# Patient Record
Sex: Female | Born: 1970 | Race: White | Hispanic: No | Smoking: Never smoker
Health system: Southern US, Community
[De-identification: ages and names within clinical notes are randomized; demographics above are authoritative.]

## PROBLEM LIST (undated history)

## (undated) DIAGNOSIS — R87619 Unspecified abnormal cytological findings in specimens from cervix uteri: Secondary | ICD-10-CM

## (undated) DIAGNOSIS — M069 Rheumatoid arthritis, unspecified: Secondary | ICD-10-CM

## (undated) DIAGNOSIS — R42 Dizziness and giddiness: Secondary | ICD-10-CM

## (undated) DIAGNOSIS — D496 Neoplasm of unspecified behavior of brain: Secondary | ICD-10-CM

## (undated) DIAGNOSIS — A64 Unspecified sexually transmitted disease: Secondary | ICD-10-CM

## (undated) DIAGNOSIS — F429 Obsessive-compulsive disorder, unspecified: Secondary | ICD-10-CM

## (undated) DIAGNOSIS — H9190 Unspecified hearing loss, unspecified ear: Secondary | ICD-10-CM

## (undated) DIAGNOSIS — N809 Endometriosis, unspecified: Secondary | ICD-10-CM

## (undated) HISTORY — PX: INTRAUTERINE DEVICE (IUD) INSERTION: SHX5877

## (undated) HISTORY — PX: CRYOTHERAPY: SHX1416

## (undated) HISTORY — PX: BRAIN TUMOR EXCISION: SHX577

## (undated) HISTORY — DX: Unspecified abnormal cytological findings in specimens from cervix uteri: R87.619

## (undated) HISTORY — DX: Unspecified sexually transmitted disease: A64

## (undated) HISTORY — DX: Unspecified hearing loss, unspecified ear: H91.90

## (undated) HISTORY — DX: Rheumatoid arthritis, unspecified: M06.9

## (undated) HISTORY — DX: Endometriosis, unspecified: N80.9

## (undated) HISTORY — DX: Obsessive-compulsive disorder, unspecified: F42.9

## (undated) HISTORY — DX: Dizziness and giddiness: R42

## (undated) HISTORY — DX: Neoplasm of unspecified behavior of brain: D49.6

---

## 2001-07-18 DIAGNOSIS — R87619 Unspecified abnormal cytological findings in specimens from cervix uteri: Secondary | ICD-10-CM

## 2001-07-18 HISTORY — DX: Unspecified abnormal cytological findings in specimens from cervix uteri: R87.619

## 2006-04-11 ENCOUNTER — Other Ambulatory Visit: Admission: RE | Admit: 2006-04-11 | Discharge: 2006-04-11 | Payer: Self-pay | Admitting: Obstetrics and Gynecology

## 2007-04-30 ENCOUNTER — Other Ambulatory Visit: Admission: RE | Admit: 2007-04-30 | Discharge: 2007-04-30 | Payer: Self-pay | Admitting: Obstetrics and Gynecology

## 2007-05-22 ENCOUNTER — Ambulatory Visit (HOSPITAL_COMMUNITY): Admission: RE | Admit: 2007-05-22 | Discharge: 2007-05-22 | Payer: Self-pay | Admitting: Obstetrics and Gynecology

## 2008-07-08 ENCOUNTER — Other Ambulatory Visit: Admission: RE | Admit: 2008-07-08 | Discharge: 2008-07-08 | Payer: Self-pay | Admitting: Obstetrics & Gynecology

## 2010-02-18 ENCOUNTER — Ambulatory Visit: Payer: Self-pay | Admitting: Emergency Medicine

## 2010-02-18 LAB — CONVERTED CEMR LAB
Bilirubin Urine: NEGATIVE
Glucose, Urine, Semiquant: NEGATIVE
Ketones, urine, test strip: NEGATIVE
Nitrite: NEGATIVE

## 2010-02-19 ENCOUNTER — Encounter: Payer: Self-pay | Admitting: Emergency Medicine

## 2010-07-18 DIAGNOSIS — N809 Endometriosis, unspecified: Secondary | ICD-10-CM

## 2010-07-18 HISTORY — DX: Endometriosis, unspecified: N80.9

## 2010-08-17 NOTE — Assessment & Plan Note (Signed)
Summary: FEVER,BODY ACHING/WB   Vital Signs:  Patient Profile:   40 Years Old Female CC:      fevert, body aches, lower right back pain x yesterday Height:     66 inches Weight:      172 pounds O2 Sat:      97 % O2 treatment:    Room Air Temp:     100.7 degrees F oral Pulse rate:   117 / minute Resp:     18 per minute BP sitting:   149 / 96  (right arm) Cuff size:   regular  Pt. in pain?   yes    Location:   lower right back    Intensity:   10    Type:       dull/ache  Vitals Entered By: Lajean Saver RN (February 18, 2010 2:36 PM)                   Updated Prior Medication List: XANAX 0.5 MG TABS (ALPRAZOLAM) prn GABAPENTIN 300 MG CAPS (GABAPENTIN) tid ZOFRAN 4 MG TABS (ONDANSETRON HCL) prn ACYCLOVIR 400 MG TABS (ACYCLOVIR) bid SEASONIQUE 0.15-0.03 &0.01 MG TABS (LEVONORGEST-ETH ESTRAD 91-DAY) once daily  Current Allergies: No known allergies History of Present Illness Chief Complaint: fevert, body aches, lower right back pain x yesterday History of Present Illness: Fever, body aches, R LBP for 1 day.  No urinary symptoms.  Ibuprofen helps.  No N/V/C/D.  She does have a history of pyelo in the past and wanted this to be treated quickly if it is a UTI  REVIEW OF SYSTEMS Constitutional Symptoms       Complains of fatigue.     Denies fever, chills, night sweats, weight loss, and weight gain.  Eyes       Denies change in vision, eye pain, eye discharge, glasses, contact lenses, and eye surgery. Ear/Nose/Throat/Mouth       Denies hearing loss/aids, change in hearing, ear pain, ear discharge, dizziness, frequent runny nose, frequent nose bleeds, sinus problems, sore throat, hoarseness, and tooth pain or bleeding.  Respiratory       Denies dry cough, productive cough, wheezing, shortness of breath, asthma, bronchitis, and emphysema/COPD.  Cardiovascular       Denies murmurs, chest pain, and tires easily with exhertion.    Gastrointestinal       Denies stomach pain,  nausea/vomiting, diarrhea, constipation, blood in bowel movements, and indigestion. Genitourniary       Denies painful urination, kidney stones, and loss of urinary control. Neurological       Denies paralysis, seizures, and fainting/blackouts. Musculoskeletal       Complains of muscle pain and joint stiffness.      Denies joint pain, decreased range of motion, redness, swelling, muscle weakness, and gout.      Comments: body aches, lower right backk pain Skin       Denies bruising, unusual mles/lumps or sores, and hair/skin or nail changes.  Psych       Denies mood changes, temper/anger issues, anxiety/stress, speech problems, depression, and sleep problems.  Past History:  Past Medical History: malignant brain tumor kidneyn infection  Past Surgical History: brain tumor removed x 2   Family History: none  Social History: Never Smoked Alcohol use-yes, 5/week Drug use-no Smoking Status:  never Drug Use:  no Physical Exam General appearance: well developed, well nourished, no acute distress Chest/Lungs: no rales, wheezes, or rhonchi bilateral, breath sounds equal without effort Heart: regular rate and  rhythm, no murmur Back: +CVAT on right Skin: no obvious rashes or lesions Assessment New Problems: UTI (ICD-599.0)   Patient Education: Patient and/or caregiver instructed in the following: Ibuprofen prn.  Plan New Medications/Changes: CIPRO 250 MG TABS (CIPROFLOXACIN HCL) 1 tab by mouth two times a day for 10 days  #20 x 0, 02/18/2010, Hoyt Koch MD  New Orders: New Patient Level III [phone] UA Dipstick w/o Micro (automated)  [81003] Planning Comments:   Hydration If symptoms get more severe, more pain, blood in urine, nausea, go to the ER   The patient and/or caregiver has been counseled thoroughly with regard to medications prescribed including dosage, schedule, interactions, rationale for use, and possible side effects and they verbalize understanding.   Diagnoses and expected course of recovery discussed and will return if not improved as expected or if the condition worsens. Patient and/or caregiver verbalized understanding.  Prescriptions: CIPRO 250 MG TABS (CIPROFLOXACIN HCL) 1 tab by mouth two times a day for 10 days  #20 x 0   Entered and Authorized by:   Hoyt Koch MD   Signed by:   Hoyt Koch MD on 02/18/2010   Method used:   Telephoned to ...         RxID:   [id number]   Orders Added: 1)  New Patient Level III [95284] 2)  UA Dipstick w/o Micro (automated)  [81003]  Laboratory Results   Urine Tests  Date/Time Received: February 18, 2010 3:33 PM  Date/Time Reported: February 18, 2010 3:33 PM   Routine Urinalysis   Color: orange Appearance: Hazy Glucose: negative   (Normal Range: Negative) Bilirubin: negative   (Normal Range: Negative) Ketone: negative   (Normal Range: Negative) Spec. Gravity: 1.025   (Normal Range: 1.003-1.035) Blood: large   (Normal Range: Negative) pH: 6.0   (Normal Range: 5.0-8.0) Protein: 100   (Normal Range: Negative) Urobilinogen: 0.2   (Normal Range: 0-1) Nitrite: negative   (Normal Range: Negative) Leukocyte Esterace: large   (Normal Range: Negative)

## 2011-01-20 ENCOUNTER — Ambulatory Visit (INDEPENDENT_AMBULATORY_CARE_PROVIDER_SITE_OTHER): Payer: PRIVATE HEALTH INSURANCE | Admitting: Cardiovascular Disease

## 2011-01-20 ENCOUNTER — Encounter: Payer: Self-pay | Admitting: *Deleted

## 2011-01-20 ENCOUNTER — Encounter: Payer: Self-pay | Admitting: Cardiovascular Disease

## 2011-01-20 VITALS — BP 124/83 | HR 98 | Resp 18 | Ht 66.0 in | Wt 163.0 lb

## 2011-01-20 DIAGNOSIS — R0600 Dyspnea, unspecified: Secondary | ICD-10-CM

## 2011-01-20 DIAGNOSIS — R079 Chest pain, unspecified: Secondary | ICD-10-CM

## 2011-01-20 DIAGNOSIS — R0602 Shortness of breath: Secondary | ICD-10-CM

## 2011-01-20 NOTE — Assessment & Plan Note (Signed)
Normal cardiopulmonary exam.  Likely related to stress  Stress echo

## 2011-01-20 NOTE — Progress Notes (Signed)
40 yo referred by [facility] for atypical SSCP dyspnea and dizzyness.  ER w.u was negative Revewed notes from 6/28 with normal ECG.  ? Vertigo.  She has a history of multiple brain tumors. S/P XRT at [facility], [place].  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  At [facility] she indicates there may be a heart issue but very vague.  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  Apparantly got disability approved last week and will be able to work part time which should help  SSCP in chest radiated to shoulders.  Not always exertional.  She does a lot of running, elliptical and yoga without a problem  Functional dyspnea seems related to stress  ROS: Denies fever, malais, weight loss, blurry vision, decreased visual acuity, cough, sputum, SOB, hemoptysis, pleuritic pain, palpitaitons, heartburn, abdominal pain, melena, lower extremity edema, claudication, or rash.  All other systems reviewed and negative   General: Affect appropriate Healthy:  appears stated age HEENT: normal Neck supple with no adenopathy JVP normal no bruits no thyromegaly Lungs clear with no wheezing and good diaphragmatic motion Heart:  S1/S2 no murmur,rub, gallop or click PMI normal Abdomen: benighn, BS positve, no tenderness, no AAA no bruit.  No HSM or HJR Distal pulses intact with no bruits No edema Neuro non-focal Skin warm and dry No muscular weakness  Medications Current Outpatient Prescriptions  Medication Sig Dispense Refill  . acetaminophen (TYLENOL) 500 MG tablet Take 500 mg by mouth every 6 (six) hours as needed.        . Acetaminophen-Caff-Pyrilamine (MIDOL MAX ST MENSTRUAL) 500-60-15 MG TABS Take by mouth.        . ACYCLOVIR EX Apply topically.        . ALPRAZolam (XANAX PO) Take by mouth.        . Gabapentin (NEURONTIN PO) Take by mouth.        . hydrochlorothiazide (,MICROZIDE/HYDRODIURIL,) 12.5 MG capsule Take 12.5 mg by mouth daily.        Marland Kitchen ibuprofen (ADVIL,MOTRIN) 200 MG tablet Take 200 mg by mouth  every 6 (six) hours as needed.        . Multiple Vitamins-Minerals (CENTRUM) tablet Take 1 tablet by mouth daily.        . ondansetron (ZOFRAN-ODT) 8 MG disintegrating tablet Take 8 mg by mouth every 8 (eight) hours as needed.        . Ondansetron HCl (ZOFRAN PO) Take by mouth.        . OxyCODONE HCl, Abuse Deter, 5 MG TABS Take by mouth.        . predniSONE (DELTASONE) 20 MG tablet Take 20 mg by mouth 3 (three) times daily.        . SERTRALINE HCL PO Take by mouth.          Allergies Oxycodone  Family History: No family history on file.  Social History: History   Social History  . Marital Status: Divorced    Spouse Name: N/A    Number of Children: N/A  . Years of Education: N/A   Occupational History  . Not on file.   Social History Main Topics  . Smoking status: Never Smoker   . Smokeless tobacco: Not on file  . Alcohol Use: Not on file  . Drug Use: Not on file  . Sexually Active: Not on file   Other Topics Concern  . Not on file   Social History Narrative  . No narrative on  file    Electrocardiogram:  NSR 01/12/11 [facility]  Normal ECG  Assessment and Plan

## 2011-01-20 NOTE — Assessment & Plan Note (Signed)
Atypical and normal ECG  F.U stress echo

## 2011-01-20 NOTE — Patient Instructions (Signed)
Your physician recommends that you schedule a follow-up appointment in:as needed Your physician has requested that you have a stress echocardiogram. For further information please visit [email or web address] Please follow instruction sheet as given.

## 2011-01-24 ENCOUNTER — Encounter: Payer: Self-pay | Admitting: *Deleted

## 2011-01-27 ENCOUNTER — Ambulatory Visit (HOSPITAL_COMMUNITY)
Admission: RE | Admit: 2011-01-27 | Discharge: 2011-01-27 | Disposition: A | Discharge status: Home / Self Care | Payer: Medicare Other | Source: Ambulatory Visit | Attending: Cardiovascular Disease | Admitting: Cardiovascular Disease

## 2011-01-27 ENCOUNTER — Ambulatory Visit (HOSPITAL_COMMUNITY): Admission: RE | Admit: 2011-01-27 | Payer: Medicare Other | Source: Ambulatory Visit

## 2011-01-27 DIAGNOSIS — R0609 Other forms of dyspnea: Secondary | ICD-10-CM | POA: Insufficient documentation

## 2011-01-27 DIAGNOSIS — R079 Chest pain, unspecified: Secondary | ICD-10-CM | POA: Insufficient documentation

## 2011-01-27 DIAGNOSIS — R0989 Other specified symptoms and signs involving the circulatory and respiratory systems: Secondary | ICD-10-CM

## 2011-01-27 NOTE — Progress Notes (Signed)
  Stress Lab Nurses Notes - Yesenia Mcclure  Yesenia Mcclure 01/27/2011  Reason for doing test: Dyspnea  Type of test: Stress Echo  Nurse performing test: Parke Poisson, RN  Nuclear Medicine Tech: Not Applicable  Echo Tech: Karrie Doffing  MD performing test: R. Dietrich Pates  Family MD: Mare Loan   Test explained and consent signed: yes  IV started: No IV started  Symptoms: mild SOB  Treatment/Intervention: None  Reason test stopped: fatigue  After recovery IV was: None  Patient to return to Nuc. Med at : NA   Patient discharged: Home  Patient's Condition upon discharge was: stable  Comments: symptoms resolved in recovery.  Erskine Speed T

## 2011-01-28 ENCOUNTER — Encounter: Payer: Self-pay | Admitting: Cardiovascular Disease

## 2011-01-28 DIAGNOSIS — B977 Papillomavirus as the cause of diseases classified elsewhere: Secondary | ICD-10-CM | POA: Insufficient documentation

## 2012-05-25 ENCOUNTER — Other Ambulatory Visit (HOSPITAL_COMMUNITY): Payer: Self-pay | Admitting: Family Medicine

## 2012-05-25 DIAGNOSIS — Z1231 Encounter for screening mammogram for malignant neoplasm of breast: Secondary | ICD-10-CM

## 2012-06-15 ENCOUNTER — Ambulatory Visit (HOSPITAL_COMMUNITY)
Admission: RE | Admit: 2012-06-15 | Discharge: 2012-06-15 | Disposition: A | Discharge status: Home / Self Care | Payer: Medicare Other | Source: Ambulatory Visit | Attending: Family Medicine | Admitting: Family Medicine

## 2012-06-15 DIAGNOSIS — Z1231 Encounter for screening mammogram for malignant neoplasm of breast: Secondary | ICD-10-CM

## 2013-05-22 ENCOUNTER — Other Ambulatory Visit: Payer: Self-pay

## 2013-05-22 ENCOUNTER — Telehealth: Payer: Self-pay | Admitting: Certified Nurse Midwife

## 2013-05-22 MED ORDER — ACYCLOVIR 400 MG PO TABS: 400.0000 mg | ORAL_TABLET | Freq: Two times a day (BID) | ORAL | Status: DC

## 2013-05-22 NOTE — Telephone Encounter (Signed)
rx sent to [facility] to approve. See approved rx

## 2013-05-22 NOTE — Telephone Encounter (Signed)
Pt states she needs refill of acyclovir sent to her pharmacy at [facility] [address] in [place]. Pt states she only uses this med when needed & needs a refill. Pt has appt for aex 1/15

## 2013-05-22 NOTE — Telephone Encounter (Signed)
Last aex was 08-06-12. At that time pt didn't need rx but told us she would call when she needs a refill of acyclovir. Pt requesting rx to be sent to [facility] [address] in [place]. Pt states she uses this med prn. aex is scheduled for 1/15. Med has been pended already for you to sign. Please approve rx

## 2013-05-22 NOTE — Telephone Encounter (Signed)
[facility] calling to fill Rx for acyclovir 400. They are a new pharmacy for patient.

## 2013-08-07 ENCOUNTER — Ambulatory Visit: Payer: Self-pay | Admitting: Certified Nurse Midwife

## 2013-08-13 ENCOUNTER — Encounter: Payer: Self-pay | Admitting: Certified Nurse Midwife

## 2013-08-14 ENCOUNTER — Ambulatory Visit: Payer: Self-pay | Admitting: Certified Nurse Midwife

## 2013-08-22 ENCOUNTER — Ambulatory Visit: Payer: Self-pay | Admitting: Certified Nurse Midwife

## 2013-08-27 ENCOUNTER — Encounter: Payer: Self-pay | Admitting: Certified Nurse Midwife

## 2013-08-27 ENCOUNTER — Ambulatory Visit (INDEPENDENT_AMBULATORY_CARE_PROVIDER_SITE_OTHER): Payer: MEDICARE | Admitting: Certified Nurse Midwife

## 2013-08-27 VITALS — BP 112/72 | HR 72 | Resp 16 | Ht 65.25 in | Wt 158.0 lb

## 2013-08-27 DIAGNOSIS — D496 Neoplasm of unspecified behavior of brain: Secondary | ICD-10-CM

## 2013-08-27 DIAGNOSIS — Z8742 Personal history of other diseases of the female genital tract: Secondary | ICD-10-CM

## 2013-08-27 DIAGNOSIS — B009 Herpesviral infection, unspecified: Secondary | ICD-10-CM

## 2013-08-27 DIAGNOSIS — Z01419 Encounter for gynecological examination (general) (routine) without abnormal findings: Secondary | ICD-10-CM

## 2013-08-27 MED ORDER — ACYCLOVIR 400 MG PO TABS: 400.0000 mg | ORAL_TABLET | Freq: Two times a day (BID) | ORAL | Status: DC

## 2013-08-27 NOTE — Progress Notes (Signed)
43 y.o. G0P0000 Divorced Caucasian Fe here for annual exam. Periods scant, no issues. Currently being treated for UTI from Urology with Bactrim DS. Patient feeling better, today. Brain tumor is stable with no changes!!! No partner change , no STD concerns or testing. Sees PCP for aex and oncology for follow up with brain tumor. All labs done with these practices. No other health issues today.  Patient's last menstrual period was 07/23/2012.          Sexually active: yes  The current method of family planning is IUD.    Exercising: yes  cardio & strength training Smoker:  no  Health Maintenance: Pap:  08-06-12 neg MMG:  2013 neg, patient plans to schedule. Colonoscopy:  none BMD:   none TDaP:  2014 Labs: none Self breast exam: done monthly   reports that she has never smoked. She does not have any smokeless tobacco history on file. She reports that she drinks about 2.0 ounces of alcohol per week. She reports that she does not use illicit drugs.  Past Medical History  Diagnosis Date  . Brain tumor     brain meningioma(3), 1 malignant, radiation  . Light headed   . Chest pain   . Abnormal Pap smear of cervix 2003    had cryo  . OCD (obsessive compulsive disorder)   . Hearing loss     left  . RA (rheumatoid arthritis)   . STD (sexually transmitted disease)     HSV2  . Endometriosis 1/12    per dr Ubaldo Glassing    Past Surgical History  Procedure Laterality Date  . Cryotherapy      for abnormal pap smear 2003  . Intrauterine device (iud) insertion      mirena inserted 2/13 due for removal 2/18    Current Outpatient Prescriptions  Medication Sig Dispense Refill  . acetaminophen (TYLENOL) 500 MG tablet Take 500 mg by mouth every 6 (six) hours as needed.        . Acetaminophen-Caff-Pyrilamine (MIDOL MAX ST MENSTRUAL) 500-60-15 MG TABS Take by mouth.        Marland Kitchen acyclovir (ZOVIRAX) 400 MG tablet Take 1 tablet (400 mg total) by mouth 2 (two) times daily.  60 tablet  2  . amitriptyline  (ELAVIL) 10 MG tablet daily.      Marland Kitchen gabapentin (NEURONTIN) 300 MG capsule Take 300 mg by mouth 3 (three) times daily.      . hydrochlorothiazide (,MICROZIDE/HYDRODIURIL,) 12.5 MG capsule Take 12.5 mg by mouth daily.        Marland Kitchen HYDROCODONE-ACETAMINOPHEN PO Take by mouth as needed.      . hydrOXYzine (ATARAX/VISTARIL) 10 MG tablet daily.      Marland Kitchen ibuprofen (ADVIL,MOTRIN) 200 MG tablet Take 200 mg by mouth every 6 (six) hours as needed.        Marland Kitchen levonorgestrel (MIRENA) 20 MCG/24HR IUD 1 each by Intrauterine route once.      . Multiple Vitamins-Minerals (CENTRUM) tablet Take 1 tablet by mouth daily.        . ondansetron (ZOFRAN-ODT) 8 MG disintegrating tablet Take 8 mg by mouth every 8 (eight) hours as needed.        . SERTRALINE HCL PO Take 100 mg by mouth daily.       Marland Kitchen sulfamethoxazole-trimethoprim (BACTRIM DS) 800-160 MG per tablet 2 (two) times daily.      Marland Kitchen UNKNOWN TO PATIENT Anxiety med take bid       No current facility-administered medications for this visit.  Family History  Problem Relation Age of Onset  . Migraines Father   . Hypertension Father   . Hypertension Mother   . Cancer Maternal Grandfather     ROS:  Pertinent items are noted in HPI.  Otherwise, a comprehensive ROS was negative.  Exam:   BP 112/72  Pulse 72  Resp 16  Ht 5' 5.25" (1.657 m)  Wt 158 lb (71.668 kg)  BMI 26.10 kg/m2  LMP 07/23/2012 Height: 5' 5.25" (165.7 cm)  Ht Readings from Last 3 Encounters:  08/27/13 5' 5.25" (1.657 m)  01/20/11 5\' 6"  (1.676 m)  02/18/10 5\' 6"  (1.676 m)    General appearance: alert, cooperative and appears stated age Head: Normocephalic, without obvious abnormality, atraumatic Neck: no adenopathy, supple, symmetrical, trachea midline and thyroid normal to inspection and palpation Lungs: clear to auscultation bilaterally Breasts: normal appearance, no masses or tenderness, No nipple retraction or dimpling, No nipple discharge or bleeding, No axillary or supraclavicular  adenopathy Heart: regular rate and rhythm Abdomen: soft, non-tender; no masses,  no organomegaly Extremities: extremities normal, atraumatic, no cyanosis or edema Skin: Skin color, texture, turgor normal. No rashes or lesions Lymph nodes: Cervical, supraclavicular, and axillary nodes normal. No abnormal inguinal nodes palpated Neurologic: Grossly normal   Pelvic: External genitalia:  no lesions              Urethra:  normal appearing urethra with no masses, tenderness or lesions              Bartholin's and Skene's: normal                 Vagina: normal appearing vagina with normal color and discharge, no lesions              Cervix: normal, non tender IUD string noted in cervix              Pap taken: yes Bimanual Exam:  Uterus:  normal size, contour, position, consistency, mobility, non-tender and anteverted              Adnexa: normal adnexa and no mass, fullness, tenderness               Rectovaginal: Confirms               Anus:  normal sphincter tone, no lesions  A:  Well Woman with normal exam  Contraception Mirena IUD  History of abnormal pap smear with Cryo  Brain Menigeomas under follow up  History of Obsessive/compulsive disorder on medication  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

## 2013-08-27 NOTE — Patient Instructions (Signed)

## 2013-08-30 LAB — IPS PAP SMEAR ONLY

## 2013-08-30 NOTE — Progress Notes (Signed)
Reviewed personally.  M. Suzanne Berneice Zettlemoyer, MD.  

## 2013-09-20 ENCOUNTER — Telehealth: Payer: Self-pay | Admitting: Certified Nurse Midwife

## 2013-09-20 NOTE — Telephone Encounter (Signed)
[employer] [facility] is calling wanting records for  08/27/13 Attn: Neoma Laming fax [phone]

## 2014-01-09 ENCOUNTER — Other Ambulatory Visit: Payer: Self-pay | Admitting: Certified Nurse Midwife

## 2014-01-09 NOTE — Telephone Encounter (Signed)
eScribe request from [facility] for refill on ACYCLOVIR Last filled - 08/27/13, #60 X 3 to be taken BID Last AEX - 08/27/13 Next AEX - 08/28/14  Please advise refills.

## 2014-04-01 ENCOUNTER — Other Ambulatory Visit: Payer: Self-pay | Admitting: Certified Nurse Midwife

## 2014-04-01 DIAGNOSIS — Z1231 Encounter for screening mammogram for malignant neoplasm of breast: Secondary | ICD-10-CM

## 2014-04-23 ENCOUNTER — Ambulatory Visit (HOSPITAL_COMMUNITY)
Admission: RE | Admit: 2014-04-23 | Discharge: 2014-04-23 | Disposition: A | Discharge status: Home / Self Care | Payer: Medicare Other | Source: Ambulatory Visit | Attending: Certified Nurse Midwife | Admitting: Certified Nurse Midwife

## 2014-04-23 DIAGNOSIS — Z1231 Encounter for screening mammogram for malignant neoplasm of breast: Secondary | ICD-10-CM | POA: Diagnosis present

## 2014-05-16 DIAGNOSIS — D420 Neoplasm of uncertain behavior of cerebral meninges: Secondary | ICD-10-CM | POA: Insufficient documentation

## 2014-05-19 ENCOUNTER — Other Ambulatory Visit: Payer: Self-pay | Admitting: *Deleted

## 2014-05-19 MED ORDER — ACYCLOVIR 400 MG PO TABS: ORAL_TABLET | ORAL | Status: DC

## 2014-05-19 NOTE — Telephone Encounter (Signed)
[facility] 400 mg tablet Last Refilled: 01/09/14 #60/3 rfs (patients take 2 daily) Last AEX: 08/27/13 with Ms. Debbie Aex Scheduled: 08/28/14 with Ms. Lance Muss to refill until AEX next year?  (Routed to Ms. Patty given Ms. Jackelyn Poling is out of office today)

## 2014-08-28 ENCOUNTER — Ambulatory Visit: Payer: MEDICARE | Admitting: Certified Nurse Midwife

## 2014-08-29 ENCOUNTER — Encounter: Payer: Self-pay | Admitting: Certified Nurse Midwife

## 2014-08-29 ENCOUNTER — Ambulatory Visit (INDEPENDENT_AMBULATORY_CARE_PROVIDER_SITE_OTHER): Payer: Medicare Other | Admitting: Certified Nurse Midwife

## 2014-08-29 VITALS — BP 120/82 | HR 70 | Resp 16 | Ht 65.5 in | Wt 154.0 lb

## 2014-08-29 DIAGNOSIS — B009 Herpesviral infection, unspecified: Secondary | ICD-10-CM

## 2014-08-29 DIAGNOSIS — Z124 Encounter for screening for malignant neoplasm of cervix: Secondary | ICD-10-CM

## 2014-08-29 DIAGNOSIS — Z01419 Encounter for gynecological examination (general) (routine) without abnormal findings: Secondary | ICD-10-CM

## 2014-08-29 MED ORDER — ACYCLOVIR 400 MG PO TABS: ORAL_TABLET | ORAL | Status: AC

## 2014-08-29 NOTE — Progress Notes (Signed)
44 y.o. G0P0000 Divorced  Caucasian Fe here for annual exam. Periods none with Mirena IUD. Brain tumor reocured and hadradiation in past year, again, repeat MRI soon for status. Sees Oncology for follow up. No STD screening needed. Sees PCP yearly for aex and labs. No health issues today.  No LMP recorded. Patient is not currently having periods (Reason: IUD).          Sexually active: Yes.    The current method of family planning is IUD.    Exercising: Yes.    interval training & strength Smoker:  no  Health Maintenance: Pap:  08-27-13 neg MMG: 04-23-14 category d, birads 1:neg  3D Colonoscopy:  none BMD:   none TDaP:  2014 Labs: none Self breast exam: not done   reports that she has never smoked. She does not have any smokeless tobacco history on file. She reports that she drinks about 2.0 - 3.0 oz of alcohol per week. She reports that she does not use illicit drugs.  Past Medical History  Diagnosis Date  . Brain tumor     brain meningioma(3), 1 malignant, radiation  . Light headed   . Chest pain   . Abnormal Pap smear of cervix 2003    had cryo  . OCD (obsessive compulsive disorder)   . Hearing loss     left  . RA (rheumatoid arthritis)   . STD (sexually transmitted disease)     HSV2  . Endometriosis 1/12    per dr Ubaldo Glassing    Past Surgical History  Procedure Laterality Date  . Cryotherapy      for abnormal pap smear 2003  . Intrauterine device (iud) insertion      mirena inserted 2/13 due for removal 2/18    Current Outpatient Prescriptions  Medication Sig Dispense Refill  . acetaminophen (TYLENOL) 500 MG tablet Take 500 mg by mouth every 6 (six) hours as needed.      Marland Kitchen acyclovir (ZOVIRAX) 400 MG tablet TAKE 1 TABLET BY MOUTH TWICE A DAY 60 tablet 12  . amitriptyline (ELAVIL) 10 MG tablet daily.    . diazepam (VALIUM) 2 MG tablet   2  . gabapentin (NEURONTIN) 300 MG capsule Take 300 mg by mouth 3 (three) times daily.    . hydrochlorothiazide (,MICROZIDE/HYDRODIURIL,)  12.5 MG capsule Take 12.5 mg by mouth daily.      Marland Kitchen HYDROCODONE-ACETAMINOPHEN PO Take by mouth as needed.    . hydrOXYzine (ATARAX/VISTARIL) 10 MG tablet daily.    Marland Kitchen ibuprofen (ADVIL,MOTRIN) 200 MG tablet Take 200 mg by mouth every 6 (six) hours as needed.      Marland Kitchen levonorgestrel (MIRENA) 20 MCG/24HR IUD 1 each by Intrauterine route once.    . Multiple Vitamins-Minerals (CENTRUM) tablet Take 1 tablet by mouth daily.      . ondansetron (ZOFRAN-ODT) 8 MG disintegrating tablet Take 8 mg by mouth every 8 (eight) hours as needed.      . SERTRALINE HCL PO Take 100 mg by mouth daily.      No current facility-administered medications for this visit.    Family History  Problem Relation Age of Onset  . Migraines Father   . Hypertension Father   . Hypertension Mother   . Cancer Maternal Grandfather     ROS:  Pertinent items are noted in HPI.  Otherwise, a comprehensive ROS was negative.  Exam:   BP 120/82 mmHg  Pulse 70  Resp 16  Ht 5' 5.5" (1.664 m)  Wt 154 lb (  69.854 kg)  BMI 25.23 kg/m2 Height: 5' 5.5" (166.4 cm) Ht Readings from Last 3 Encounters:  08/29/14 5' 5.5" (1.664 m)  08/27/13 5' 5.25" (1.657 m)  01/20/11 5\' 6"  (1.676 m)    General appearance: alert, cooperative and appears stated age Head: Normocephalic, without obvious abnormality, atraumatic Neck: no adenopathy, supple, symmetrical, trachea midline and thyroid normal to inspection and palpation Lungs: clear to auscultation bilaterally Breasts: normal appearance, no masses or tenderness, No nipple retraction or dimpling, No nipple discharge or bleeding, No axillary or supraclavicular adenopathy Heart: regular rate and rhythm Abdomen: soft, non-tender; no masses,  no organomegaly Extremities: extremities normal, atraumatic, no cyanosis or edema Skin: Skin color, texture, turgor normal. No rashes or lesions Lymph nodes: Cervical, supraclavicular, and axillary nodes normal. No abnormal inguinal nodes palpated Neurologic:  Grossly normal   Pelvic: External genitalia:  no lesions              Urethra:  normal appearing urethra with no masses, tenderness or lesions              Bartholin's and Skene's: normal                 Vagina: normal appearing vagina with normal color and discharge, no lesions              Cervix: normal, non tender, no lesions IUD string noted              Pap taken: Yes.   Bimanual Exam:  Uterus:  normal size, contour, position, consistency, mobility, non-tender              Adnexa: normal adnexa and no mass, fullness, tenderness               Rectovaginal: Confirms               Anus:  normal sphincter tone, no lesions  Chaperone present: Yes  A:  Well Woman with normal exam  Contraception Mirena IUD  Brain tumor again with radiation under follow up  History of abnormal pap with cryo  History of HSV uses acyclovir needs rx  P:   Reviewed health and wellness pertinent to exam  Aware of warning signs with IUD, removal 2018  Rx  Acyclovir see order  Pap smear taken today with HPV reflex  Continue MD follow up as indicated   counseled on breast self exam, mammography screening, adequate intake of calcium and vitamin D, diet and exercise  return annually or prn  An After Visit Summary was printed and given to the patient.

## 2014-08-29 NOTE — Progress Notes (Signed)
Reviewed personally.  M. Suzanne Jeniel Slauson, MD.  

## 2014-08-29 NOTE — Patient Instructions (Signed)

## 2014-09-03 LAB — IPS PAP TEST WITH HPV

## 2014-10-29 ENCOUNTER — Ambulatory Visit: Payer: Self-pay | Admitting: Certified Nurse Midwife

## 2015-09-18 ENCOUNTER — Ambulatory Visit: Payer: Medicare Other | Admitting: Certified Nurse Midwife

## 2015-09-18 ENCOUNTER — Telehealth: Payer: Self-pay | Admitting: Certified Nurse Midwife

## 2015-09-18 NOTE — Telephone Encounter (Signed)
Patient cancelled appt today for AEX due to her being sick. Rescheduled patient for 09/22/2015 at 10:30.

## 2015-09-22 ENCOUNTER — Ambulatory Visit: Payer: Medicare Other | Admitting: Certified Nurse Midwife

## 2015-09-22 ENCOUNTER — Telehealth: Payer: Self-pay | Admitting: Certified Nurse Midwife

## 2015-09-22 NOTE — Telephone Encounter (Signed)
Patient canceled her 10:30am appointment today due to illness. Patient rescheduled to 09/29/15.

## 2015-09-29 ENCOUNTER — Ambulatory Visit: Payer: Medicare Other | Admitting: Certified Nurse Midwife

## 2015-10-05 IMAGING — MG MM DIGITAL SCREENING BILAT
4 series · 4 of 4 positions shown · non-contrast
Comparison: Previous exam(s).

CLINICAL DATA: Screening.

EXAM:
DIGITAL SCREENING BILATERAL MAMMOGRAM WITH CAD

[R CC]
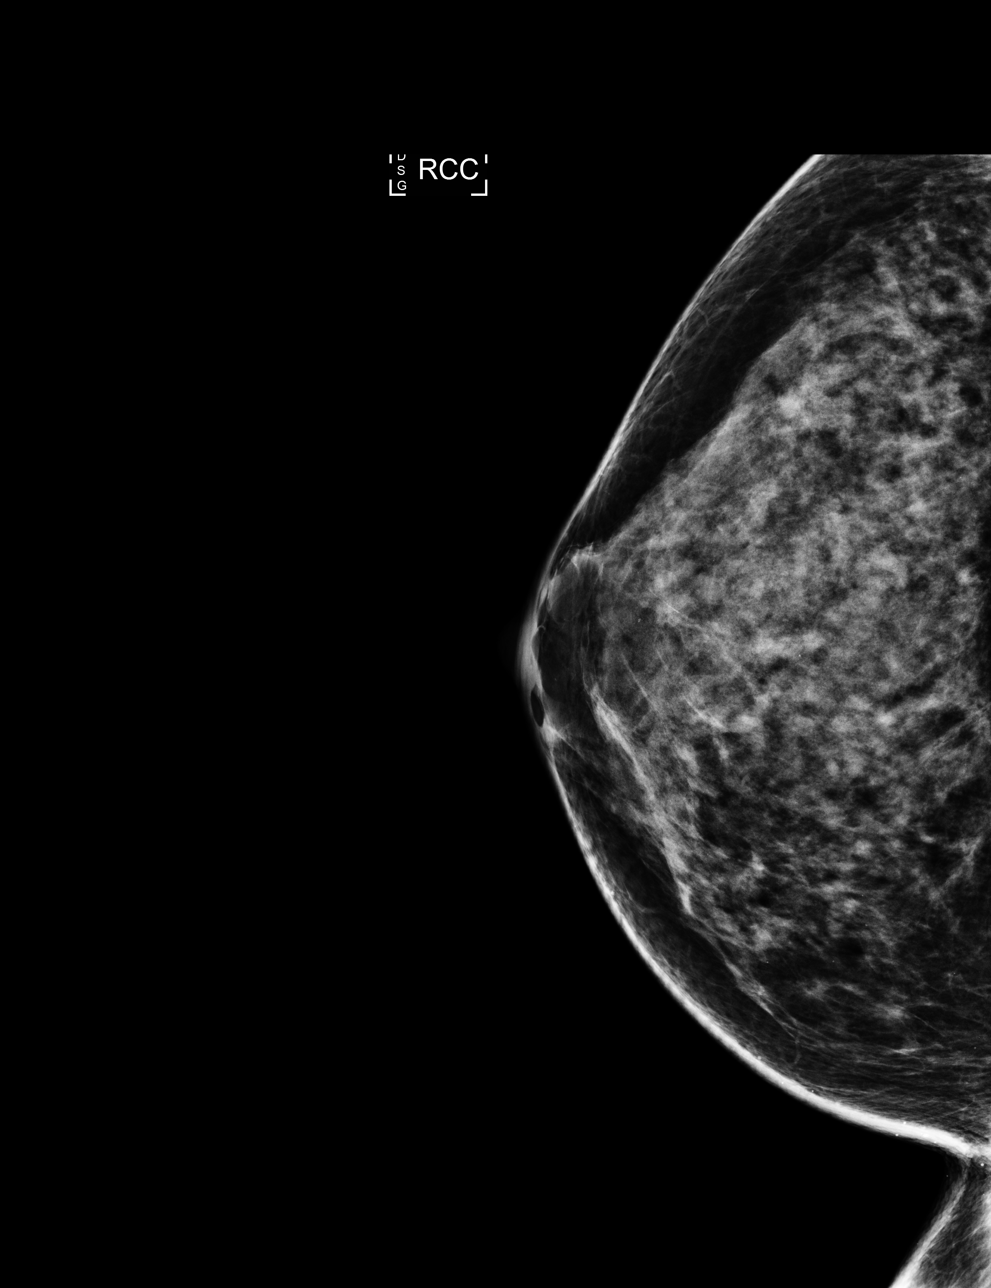

[L CC]
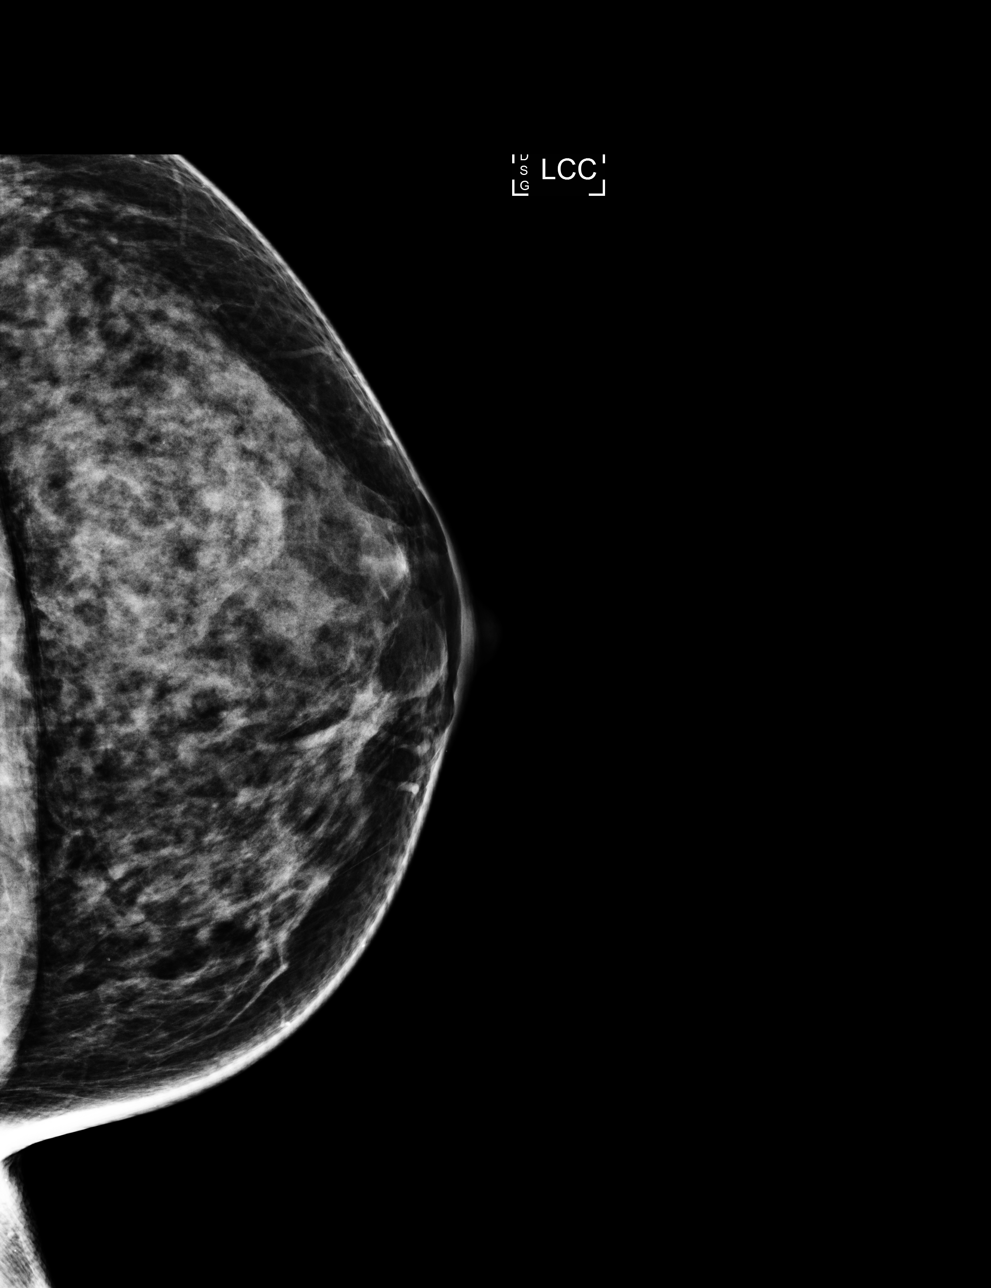

[L MLO]
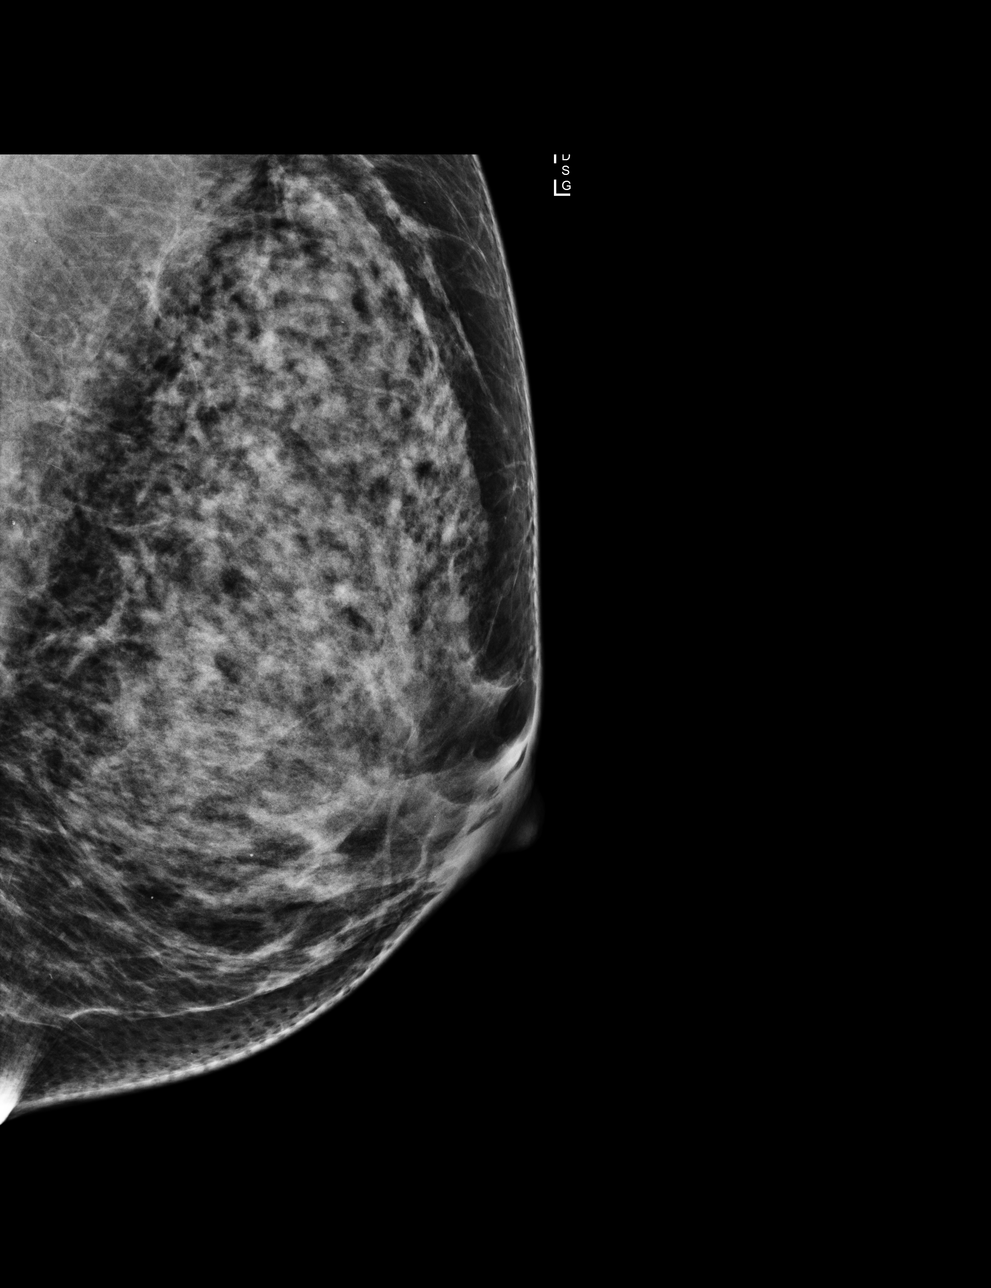

[R MLO]
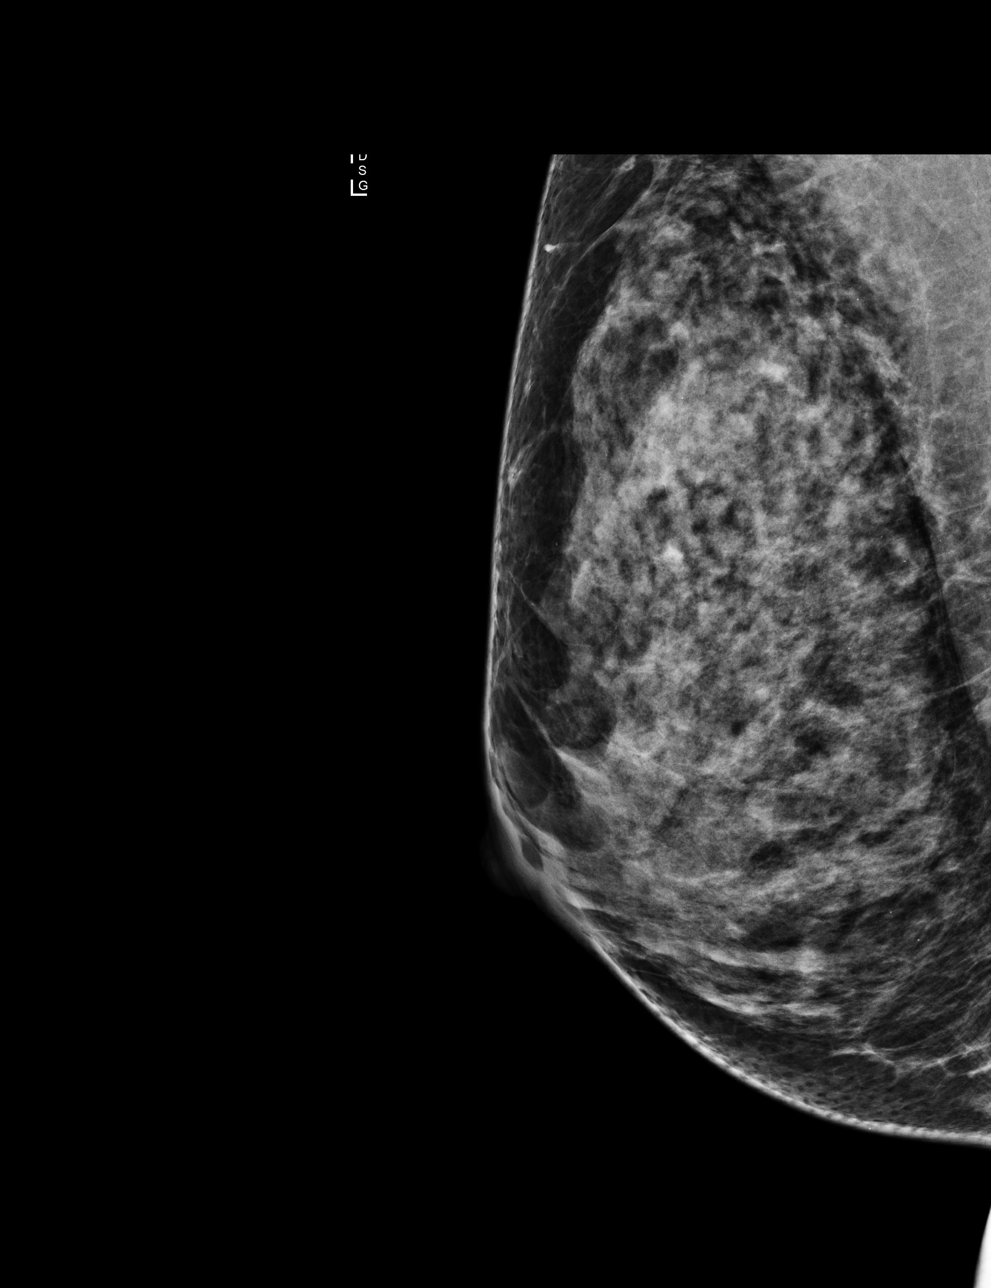

[4 of 4 positions shown; findings below may reference images not displayed]

ACR Breast Density Category d: The breast tissue is extremely dense,
which lowers the sensitivity of mammography.
FINDINGS: There are no findings suspicious for malignancy. Images were
processed with CAD.
IMPRESSION: No mammographic evidence of malignancy. A result letter of this
screening mammogram will be mailed directly to the patient.

RECOMMENDATION:
Screening mammogram in one year. (Code:BD-[id number])

BI-RADS CATEGORY  1: Negative.

## 2015-10-09 ENCOUNTER — Encounter: Payer: Self-pay | Admitting: Certified Nurse Midwife

## 2015-10-09 ENCOUNTER — Ambulatory Visit (INDEPENDENT_AMBULATORY_CARE_PROVIDER_SITE_OTHER): Payer: Medicare Other | Admitting: Certified Nurse Midwife

## 2015-10-09 VITALS — BP 110/78 | HR 70 | Resp 16 | Ht 65.75 in | Wt 168.0 lb

## 2015-10-09 DIAGNOSIS — Z01419 Encounter for gynecological examination (general) (routine) without abnormal findings: Secondary | ICD-10-CM | POA: Diagnosis not present

## 2015-10-09 DIAGNOSIS — Z0000 Encounter for general adult medical examination without abnormal findings: Secondary | ICD-10-CM | POA: Diagnosis not present

## 2015-10-09 DIAGNOSIS — Z124 Encounter for screening for malignant neoplasm of cervix: Secondary | ICD-10-CM

## 2015-10-09 NOTE — Progress Notes (Signed)
45 y.o. G0P0000 Divorced  Caucasian Fe here for annual exam. Periods scant with Mirena IUD. Sees PCP for aex/labs. Sees MD for medication management of depression/anxiety. Has noted slight weight gain over past year, but has not been sleeping as well. She also has increased her work outs. No other health issues today.  No LMP recorded. Patient is not currently having periods (Reason: IUD).          Sexually active: No.  The current method of family planning is IUD.    Exercising: Yes.    strength training, running, cycling Smoker:  no  Health Maintenance: Pap:  08-29-14 neg HPV HR neg MMG:  04-23-14 category d density birads 1:neg Colonoscopy: none BMD:   none TDaP:  2014 Shingles: no Pneumonia: no Hep C and HIV: not done Labs: none Self breast exam: done occ   reports that she has never smoked. She does not have any smokeless tobacco history on file. She reports that she drinks alcohol. She reports that she does not use illicit drugs.  Past Medical History  Diagnosis Date  . Brain tumor ([place])     brain meningioma(3), 1 malignant, radiation, pt had radiation again 2015  . Light headed   . Chest pain   . Abnormal Pap smear of cervix 2003    had cryo  . OCD (obsessive compulsive disorder)   . Hearing loss     left  . RA (rheumatoid arthritis) ([place])   . STD (sexually transmitted disease)     HSV2  . Endometriosis 1/12    per dr Ubaldo Glassing    Past Surgical History  Procedure Laterality Date  . Cryotherapy      for abnormal pap smear 2003  . Intrauterine device (iud) insertion      mirena inserted 2/13 due for removal 2/18    Current Outpatient Prescriptions  Medication Sig Dispense Refill  . acetaminophen (TYLENOL) 500 MG tablet Take 500 mg by mouth every 6 (six) hours as needed.      Marland Kitchen acyclovir (ZOVIRAX) 400 MG tablet TAKE 1 TABLET BY MOUTH TWICE A DAY 60 tablet 12  . amitriptyline (ELAVIL) 10 MG tablet daily.    . diazepam (VALIUM) 2 MG tablet   2  . gabapentin  (NEURONTIN) 300 MG capsule Take 300 mg by mouth 3 (three) times daily.    . hydrochlorothiazide (,MICROZIDE/HYDRODIURIL,) 12.5 MG capsule Take 12.5 mg by mouth daily.      Marland Kitchen HYDROCODONE-ACETAMINOPHEN PO Take by mouth as needed.    . hydrOXYzine (ATARAX/VISTARIL) 10 MG tablet daily.    Marland Kitchen ibuprofen (ADVIL,MOTRIN) 200 MG tablet Take 200 mg by mouth every 6 (six) hours as needed.      Marland Kitchen levonorgestrel (MIRENA) 20 MCG/24HR IUD 1 each by Intrauterine route once.    . Multiple Vitamins-Minerals (CENTRUM) tablet Take 1 tablet by mouth daily.      . sertraline (ZOLOFT) 100 MG tablet TK 2 TS PO QD  0   No current facility-administered medications for this visit.    Family History  Problem Relation Age of Onset  . Migraines Father   . Hypertension Father   . Hypertension Mother   . Cancer Maternal Grandfather     ROS:  Pertinent items are noted in HPI.  Otherwise, a comprehensive ROS was negative.  Exam:   BP 110/78 mmHg  Pulse 70  Resp 16  Ht 5' 5.75" (1.67 m)  Wt 168 lb (76.204 kg)  BMI 27.32 kg/m2 Height: 5' 5.75" (167  cm) Ht Readings from Last 3 Encounters:  10/09/15 5' 5.75" (1.67 m)  08/29/14 5' 5.5" (1.664 m)  08/27/13 5' 5.25" (1.657 m)    General appearance: alert, cooperative and appears stated age Head: Normocephalic, without obvious abnormality, atraumatic Neck: no adenopathy, supple, symmetrical, trachea midline and thyroid normal to inspection and palpation Lungs: clear to auscultation bilaterally Breasts: normal appearance, no masses or tenderness, No nipple retraction or dimpling, No nipple discharge or bleeding, No axillary or supraclavicular adenopathy Heart: regular rate and rhythm Abdomen: soft, non-tender; no masses,  no organomegaly Extremities: extremities normal, atraumatic, no cyanosis or edema Skin: Skin color, texture, turgor normal. No rashes or lesions Lymph nodes: Cervical, supraclavicular, and axillary nodes normal. No abnormal inguinal nodes  palpated Neurologic: Grossly normal   Pelvic: External genitalia:  no lesions              Urethra:  normal appearing urethra with no masses, tenderness or lesions              Bartholin's and Skene's: normal                 Vagina: normal appearing vagina with normal color and discharge, no lesions              Cervix: no cervical motion tenderness, no lesions and normal, IUD string noted in cervix, blood noted              Pap taken: Yes.   Bimanual Exam:  Uterus:  normal size, contour, position, consistency, mobility, non-tender and anteverted              Adnexa: normal adnexa and no mass, fullness, tenderness               Rectovaginal: Confirms               Anus:  normal sphincter tone, no lesions  Chaperone present: yes  A:  Well Woman with normal exam   Contraception Mirena IUD removal due 2/18  Depression /anxiety management with MD  P:   Reviewed health and wellness pertinent to exam  Reviewed warnings with IUD and need to advise. Patient will call in 1/18 to schedule removal and ? Re-insertion.  Pap smear as above with HPV reflex  Continue MD follow up as indicated   counseled on breast self exam, mammography screening, adequate intake of calcium and vitamin D, diet and exercise  return annually or prn  An After Visit Summary was printed and given to the patient.

## 2015-10-12 LAB — IPS PAP TEST WITH REFLEX TO HPV

## 2015-10-14 NOTE — Progress Notes (Signed)
Encounter reviewed Twyla Dais, MD   

## 2015-10-21 ENCOUNTER — Other Ambulatory Visit: Payer: Self-pay

## 2015-10-21 DIAGNOSIS — Z1231 Encounter for screening mammogram for malignant neoplasm of breast: Secondary | ICD-10-CM

## 2015-10-23 ENCOUNTER — Ambulatory Visit: Payer: Medicare Other

## 2016-01-20 ENCOUNTER — Ambulatory Visit
Admission: RE | Admit: 2016-01-20 | Discharge: 2016-01-20 | Disposition: A | Discharge status: Home / Self Care | Payer: Medicare Other | Source: Ambulatory Visit

## 2016-01-20 DIAGNOSIS — Z1231 Encounter for screening mammogram for malignant neoplasm of breast: Secondary | ICD-10-CM

## 2016-06-06 DIAGNOSIS — F332 Major depressive disorder, recurrent severe without psychotic features: Secondary | ICD-10-CM | POA: Insufficient documentation

## 2016-10-03 DIAGNOSIS — F411 Generalized anxiety disorder: Secondary | ICD-10-CM | POA: Insufficient documentation

## 2016-10-03 DIAGNOSIS — F1320 Sedative, hypnotic or anxiolytic dependence, uncomplicated: Secondary | ICD-10-CM | POA: Insufficient documentation

## 2016-10-03 DIAGNOSIS — F419 Anxiety disorder, unspecified: Secondary | ICD-10-CM | POA: Insufficient documentation

## 2016-10-12 ENCOUNTER — Encounter: Payer: Self-pay | Admitting: Certified Nurse Midwife

## 2016-10-12 ENCOUNTER — Ambulatory Visit (INDEPENDENT_AMBULATORY_CARE_PROVIDER_SITE_OTHER): Payer: Medicare Other | Admitting: Certified Nurse Midwife

## 2016-10-12 VITALS — BP 110/64 | HR 64 | Resp 16 | Ht 65.5 in | Wt 165.0 lb

## 2016-10-12 DIAGNOSIS — N912 Amenorrhea, unspecified: Secondary | ICD-10-CM | POA: Diagnosis not present

## 2016-10-12 DIAGNOSIS — Z30431 Encounter for routine checking of intrauterine contraceptive device: Secondary | ICD-10-CM

## 2016-10-12 DIAGNOSIS — Z01419 Encounter for gynecological examination (general) (routine) without abnormal findings: Secondary | ICD-10-CM

## 2016-10-12 LAB — POCT URINE PREGNANCY: Preg Test, Ur: NEGATIVE

## 2016-10-12 NOTE — Patient Instructions (Signed)

## 2016-10-12 NOTE — Progress Notes (Signed)
46 y.o. G0P0000 Divorced  Caucasian Fe here for annual exam. Periods none with Mirena IUD was due for removal February. Sees Dr. Lavone Neri for PCP and lab/aex hypertension/anxiety management.  Brain Meningiomas still being followed at [facility] with Neurology. All stable, but some increasing headache in the past 2 weeks. Has appointment next week for evaluation.. Not sexually active in three years, aware Mirena IUD overdue for replacement. Would like to exchange for new IUD for cycle control. No other health issues today.  No LMP recorded. Patient is not currently having periods (Reason: IUD).          Sexually active: No.  The current method of family planning is IUD.    Exercising: Yes.    strength trainin,g running, cycling Smoker:  no  Health Maintenance: Pap:  08-29-14 neg HPV HR neg, 10-09-15 neg MMG:  01-20-16 category d density birads 1:neg Colonoscopy:  none BMD:   none TDaP:  2014 Shingles: no Pneumonia: no Hep C and HIV: not done Labs: upt-neg Self breast exam: done occ   reports that she has never smoked. She has never used smokeless tobacco. She reports that she drinks about 8.4 - 9.6 oz of alcohol per week . She reports that she does not use drugs.  Past Medical History:  Diagnosis Date  . Abnormal Pap smear of cervix 2003   had cryo  . Brain tumor ([place])    brain meningioma(3), 1 malignant, radiation, pt had radiation again 2015  . Chest pain   . Endometriosis 1/12   per dr Ubaldo Glassing  . Hearing loss    left  . Light headed   . OCD (obsessive compulsive disorder)   . RA (rheumatoid arthritis) ([place])   . STD (sexually transmitted disease)    HSV2    Past Surgical History:  Procedure Laterality Date  . CRYOTHERAPY     for abnormal pap smear 2003  . INTRAUTERINE DEVICE (IUD) INSERTION     mirena inserted 2/13 due for removal 2/18    Current Outpatient Prescriptions  Medication Sig Dispense Refill  . acetaminophen (TYLENOL) 500 MG tablet Take 500 mg by mouth every 6 (six)  hours as needed.      Marland Kitchen acyclovir (ZOVIRAX) 400 MG tablet TAKE 1 TABLET BY MOUTH TWICE A DAY 60 tablet 12  . amitriptyline (ELAVIL) 10 MG tablet daily.    . diazepam (VALIUM) 2 MG tablet   2  . gabapentin (NEURONTIN) 300 MG capsule Take 300 mg by mouth 3 (three) times daily.    . hydrochlorothiazide (,MICROZIDE/HYDRODIURIL,) 12.5 MG capsule Take 12.5 mg by mouth daily.      Marland Kitchen HYDROCODONE-ACETAMINOPHEN PO Take by mouth as needed.    . hydrOXYzine (ATARAX/VISTARIL) 10 MG tablet daily.    Marland Kitchen ibuprofen (ADVIL,MOTRIN) 200 MG tablet Take 200 mg by mouth every 6 (six) hours as needed.      Marland Kitchen levonorgestrel (MIRENA) 20 MCG/24HR IUD 1 each by Intrauterine route once.    . Multiple Vitamins-Minerals (CENTRUM) tablet Take 1 tablet by mouth daily.      . sertraline (ZOLOFT) 100 MG tablet TK 2 TS PO QD  0   No current facility-administered medications for this visit.     Family History  Problem Relation Age of Onset  . Migraines Father   . Hypertension Father   . Hypertension Mother   . Cancer Maternal Grandfather     ROS:  Pertinent items are noted in HPI.  Otherwise, a comprehensive ROS was negative.  Exam:  BP 110/64   Pulse 64   Resp 16   Ht 5' 5.5" (1.664 m)   Wt 165 lb (74.8 kg)   BMI 27.04 kg/m  Height: 5' 5.5" (166.4 cm) Ht Readings from Last 3 Encounters:  10/12/16 5' 5.5" (1.664 m)  10/09/15 5' 5.75" (1.67 m)  08/29/14 5' 5.5" (1.664 m)    General appearance: alert, cooperative and appears stated age Head: Normocephalic, without obvious abnormality, atraumatic Neck: no adenopathy, supple, symmetrical, trachea midline and thyroid normal to inspection and palpation Lungs: clear to auscultation bilaterally Breasts: normal appearance, no masses or tenderness, No nipple retraction or dimpling, No nipple discharge or bleeding, No axillary or supraclavicular adenopathy Heart: regular rate and rhythm Abdomen: soft, non-tender; no masses,  no organomegaly Extremities: extremities  normal, atraumatic, no cyanosis or edema Skin: Skin color, texture, turgor normal. No rashes or lesions Lymph nodes: Cervical, supraclavicular, and axillary nodes normal. No abnormal inguinal nodes palpated Neurologic: Grossly normal   Pelvic: External genitalia:  no lesions              Urethra:  normal appearing urethra with no masses, tenderness or lesions              Bartholin's and Skene's: normal                 Vagina: normal appearing vagina with normal color and discharge, no lesions              Cervix: no cervical motion tenderness, no lesions, nulliparous appearance and IUD string noted in Cervix              Pap taken: No. Bimanual Exam:  Uterus:  normal size, contour, position, consistency, mobility, non-tender and anteverted              Adnexa: normal adnexa and no mass, fullness, tenderness               Rectovaginal: Confirms               Anus:  normal sphincter tone, no lesions  Chaperone present: yes  A:  Well Woman with normal exam  Contraception/cycle control Mirena IUD, overdue for replacement(2/18) would like replacement  Previous history of Cryo 2003 for abn. Pap ? etiology  Hypertension, anxiety, management with PCP  Brain meningiomas under follow up with [facility], has had radiation in past  P:   Reviewed health and wellness pertinent to exam  Risks/benefits of MIrena IUD discussed, bleeding expectations, patient would like exchange. Patient will be called with insurance infor and scheduled for replacement. She will given Cytotec pm before and am of insertion. Questions addressed.  Continue follow up with MD as indicated  Pap smear as above not taken   counseled on breast self exam, mammography screening, STD prevention, HIV risk factors and prevention, adequate intake of calcium and vitamin D, diet and exercise  return annually or prn  An After Visit Summary was printed and given to the patient.

## 2016-10-14 NOTE — Progress Notes (Signed)
Encounter reviewed Willona Phariss, MD   

## 2016-10-21 ENCOUNTER — Telehealth: Payer: Self-pay | Admitting: Certified Nurse Midwife

## 2016-10-21 NOTE — Telephone Encounter (Signed)
Call to patient regarding benefits for Mirena removal and reinsertion. Patient understood benefits as stated. Patient agreeable to discuss with triage nurse prior to scheduling.  Discussed with Kaitlyn. Agreeable to route message to her to call patient.

## 2016-10-24 NOTE — Telephone Encounter (Signed)
Left message to call Kaitlyn at [phone]. 

## 2016-10-25 NOTE — Telephone Encounter (Signed)
Spoke with patient. Patient states that she is interested in having IUD removed and staring on new form of birth control. Would like to do something that is going to be cost effective such as POP. Advised will review alternative recommendations with Melvia Heaps CNM and return call. Patient is agreeable.

## 2016-10-27 NOTE — Telephone Encounter (Signed)
Left detailed message at number provided [phone], okay per ROI. Advised of message as seen below from [place] and that we will be in touch with her as soon as this is review with Neurology. Advised to return call with any additional questions.

## 2016-10-27 NOTE — Telephone Encounter (Signed)
I am waiting on response from Neurology regarding POP use.

## 2016-11-02 NOTE — Telephone Encounter (Signed)
I meant her oncologist, she was to have a visit and advise on status with oncology. Any status change. I still feel IUD is safer choice for her, that OCP.

## 2016-11-02 NOTE — Telephone Encounter (Signed)
Patient is calling to check on status update for telephone call. Advised will review with Melvia Heaps CNM and return call. Patient is agreeable. Patient is asking who Melvia Heaps CNM reached out to as her Neurologist is no longer practicing and she has not been to the neurologist in a while. Has been seeing Dr.Kirkpatrick with oncology for all follow up. Advised will review with Melvia Heaps CNM and return call. Patient is agreeable.

## 2016-11-03 NOTE — Telephone Encounter (Signed)
Will await his recommendation

## 2016-11-03 NOTE — Telephone Encounter (Signed)
Call to Dr.Patterson's office at [phone]. Spoke with Inez Catalina who took a message to speak with Dr.Patterson regarding recommendations for patient switching to POP. Inez Catalina will return call with further information.

## 2016-11-03 NOTE — Telephone Encounter (Signed)
Spoke with Yesenia Mcclure who reviewed with Dr.Patterson starting patient on POP. Per Dr.Patterson patient should not start on POP. Asked Yesenia Mcclure about patient continuing with Mirena. Yesenia Mcclure passed the phone to Wallace who states that the patient may continue with Mirena as this is safe, but cannot be on POP as it places her at risk for regrowth of her meningioma.

## 2016-11-03 NOTE — Telephone Encounter (Signed)
Return call to [place],

## 2016-11-04 MED ORDER — MISOPROSTOL 200 MCG PO TABS: ORAL_TABLET | ORAL | 0 refills | Status: DC

## 2016-11-04 NOTE — Telephone Encounter (Signed)
Spoke with patient. Patient would like to schedule Mirena IUD removal and reinsertion tentatively based on cost compared to cost of BTSP and discussion with Dr.Miller on 11/08/2016. Patient would like insurance department to call her to review her coverage for both. Patient is not currently sexually active and not having regular cyles with IUD. Advised she will need to remain abstainant until IUD insertion. If she becomes sexually active will need to notify the office in case appointment needs to be rescheduled. Appointment for IUD removal and reinsertion scheduled for 11/18/2016 at 10 am with Dr.Miller. Pre procedure instructions given.  Motrin instructions given. Motrin=Advil=Ibuprofen, 800 mg one hour before appointment. Eat a meal and hydrate well before appointment. Cytotec instructions given.   Place 1 tablet PV night before the procedure. Place 1 tablet PV the morning of the procedure. Rx for Cytotec 200 mcg #2 0RF sent to pharmacy on file.  Patient verbalizes understanding.  Cc: Theresia Lo  Encounter previously closed.

## 2016-11-04 NOTE — Telephone Encounter (Signed)
Patient current IUD has expired, so she needs use condoms for sexually activity until this is replaced. Please let her know of recommendations of per her MD that POP or OCP are not recommended. She needs to schedule replacement of Mirena.

## 2016-11-04 NOTE — Addendum Note (Signed)
Addended by: Gwendlyn Deutscher on: 11/04/2016 03:19 PM   Modules accepted: Orders

## 2016-11-04 NOTE — Telephone Encounter (Signed)
Call to patient. Reviewed update that POP are not an option (see below) . Based on this, patient will need to decide if desires to proceed with Mirena replacement or look into option for BTSP/ salpingectomy.  Advised we are just presenting her with options to consider. If interested, will need office visit with MD to discuss procedure and risks/benefits.  Patient is very interested in this option. Aware this has had tentative insurance precert initiated. Appointment scheduled for 11-08-16 with Dr Sabra Heck (who previously reviewed case while precerting Mirena).  Routing to provider for final review  Encounter closed.      CC: Dr Sabra Heck, Juluis Rainier, appt 11-08-16.

## 2016-11-04 NOTE — Telephone Encounter (Signed)
Patient calling requesting a return call regarding the below message.

## 2016-11-08 ENCOUNTER — Ambulatory Visit (INDEPENDENT_AMBULATORY_CARE_PROVIDER_SITE_OTHER): Payer: Medicare Other | Admitting: Obstetrics & Gynecology

## 2016-11-08 ENCOUNTER — Encounter: Payer: Self-pay | Admitting: Obstetrics & Gynecology

## 2016-11-08 VITALS — BP 124/88 | HR 76 | Ht 65.5 in | Wt 162.0 lb

## 2016-11-08 DIAGNOSIS — Z3009 Encounter for other general counseling and advice on contraception: Secondary | ICD-10-CM | POA: Diagnosis not present

## 2016-11-10 ENCOUNTER — Encounter: Payer: Self-pay | Admitting: Obstetrics & Gynecology

## 2016-11-10 NOTE — Progress Notes (Signed)
GYNECOLOGY  VISIT   HPI: 46 y.o. G37P0000 Divorced Caucasian female here for discussion of options for contraception.  Pt has complicated PMHx including intracranial meningioma that was treated with radiation completed initially in 12/08 and then again 11/15.  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  She had a brain MRI 10/16/16 showing slight increase in size (1-60mm) of the meningoma.  The MRI was done due to worsening facial pain.  She has been seen in palliative care pain management clinic at [facility] recently.    Pt has a Mirena IUD that she has been using for contraception and cycle control.  Pt has done very well with this IUD.  Continues to not have cycles at this point.  She has a hx of endometriosis as well and has good pelvic pain control.  Her IUD was due for removal and replacement in February.  She is now on disability and has medicare.  There is no coverage for Mirena IUD under her current coverage.    She has been advised not to take oral progesterone contraceptives and to not be pregnant by her providers at [facility].  She is here to discuss options.  Tubal ligation vs salpingectomy for long term birth control discussed.  She is aware neither of these will do anything for cycle control.  Surgery, incision locations, risks and benefits were reviewed.  She would much prefer an IUD if this were possible.    GYNECOLOGIC HISTORY: No LMP recorded. Patient is not currently having periods (Reason: IUD). Contraception: Mirena IUD  Patient Active Problem List   Diagnosis Date Noted  . Brain tumor ([place]) 08/27/2013    Class: History of  . Dyspnea 01/20/2011  . Chest pain 01/20/2011  . UTI 02/18/2010    Past Medical History:  Diagnosis Date  . Abnormal Pap smear of cervix 2003   had cryo  . Brain tumor ([place])    brain meningioma(3), 1 malignant, radiation, pt had radiation again 2015  . Chest pain   . Endometriosis 1/12   per dr Ubaldo Glassing  . Hearing loss    left  . Light headed   . OCD  (obsessive compulsive disorder)   . RA (rheumatoid arthritis) ([place])   . STD (sexually transmitted disease)    HSV2    Past Surgical History:  Procedure Laterality Date  . CRYOTHERAPY     for abnormal pap smear 2003  . INTRAUTERINE DEVICE (IUD) INSERTION     mirena inserted 2/13 due for removal 2/18    MEDS:  Reviewed in EPIC and UTD  ALLERGIES: Other and Oxycodone  Family History  Problem Relation Age of Onset  . Migraines Father   . Hypertension Father   . Hypertension Mother   . Cancer Maternal Grandfather     SH:  Long term relationship, non smoker  Review of Systems  Constitutional: Negative.   Respiratory: Negative.   Cardiovascular: Negative.   Neurological:       Facial pain, headaches    PHYSICAL EXAMINATION:    BP 124/88 (BP Location: Right Arm, Patient Position: Sitting, Cuff Size: Normal)   Pulse 76   Ht 5' 5.5" (1.664 m)   Wt 162 lb (73.5 kg)   BMI 26.55 kg/m     Physical Exam  Constitutional: She is oriented to person, place, and time. She appears well-developed and well-nourished.  Neurological: She is alert and oriented to person, place, and time.  Psychiatric: She has a normal mood and affect.  No other  exam performed  Assessment: Desired contraception, would prefer Mirena IUD Considering salpingectomy  Plan: Will investigate options for pt further.  Will write a letter for possible coverage for Mirena IUD.  Pt would like to know all out of pocket costs for Mirena IUD.  Discussed possible placement at [facility] as well.  She will investigate this as well.   ~25 minutes spent with patient >50% of time was in face to face discussion of above.

## 2016-11-16 ENCOUNTER — Encounter: Payer: Self-pay | Admitting: Obstetrics & Gynecology

## 2016-11-17 ENCOUNTER — Telehealth: Payer: Self-pay | Admitting: Obstetrics & Gynecology

## 2016-11-17 NOTE — Telephone Encounter (Signed)
Patient called to see if we had received a response from [facility] in regards to an appeal for coverage of IUD insertion. Advised patient to date we have not received a response. Patient requested to cancel appointment on 11/18/16 for IUD insertion, stating she would like to wait until [facility] has made a decision on the appeal.  Patient states she will reschedule once there is a determination.  Routing to Dr Sabra Heck  cc: Thayer Ohm

## 2016-11-17 NOTE — Telephone Encounter (Signed)
See telephone encounter dated 11/17/16.

## 2016-11-17 NOTE — Telephone Encounter (Signed)
Response came today.  Will give to Seth Bake to call pt.

## 2016-11-18 ENCOUNTER — Ambulatory Visit: Payer: Medicare Other | Admitting: Obstetrics & Gynecology

## 2016-11-21 NOTE — Telephone Encounter (Signed)
Patient spoke with practice administrator regarding IUD and decided to proceed with IUD removal.  Call transferred to me to answer clinical questions. Patient asking if she is still to take Cytotec since not having new IUD inserted. She already has medication.  Due to history of cryosurgery, advised to proceed with taking Cytotec as instructed by Dr Sabra Heck.  Also, instructed to take Motrin 800 mg one hour prior with food. Advised Dr Sabra Heck will review call and we will call her back if she has different instructions.  Appointment is scheduled for Monday 12-26-16 at 1000.   Agree with Cytotec for IUD removal?

## 2016-11-21 NOTE — Telephone Encounter (Signed)
Encounter closed

## 2016-11-21 NOTE — Telephone Encounter (Signed)
Yes.  This is fine.

## 2016-12-01 ENCOUNTER — Encounter: Payer: Self-pay | Admitting: Obstetrics & Gynecology

## 2016-12-02 ENCOUNTER — Encounter: Payer: Self-pay | Admitting: Obstetrics & Gynecology

## 2016-12-08 DIAGNOSIS — R519 Headache, unspecified: Secondary | ICD-10-CM | POA: Insufficient documentation

## 2016-12-08 DIAGNOSIS — G8929 Other chronic pain: Secondary | ICD-10-CM | POA: Insufficient documentation

## 2016-12-08 DIAGNOSIS — R51 Headache: Secondary | ICD-10-CM

## 2016-12-26 ENCOUNTER — Ambulatory Visit (INDEPENDENT_AMBULATORY_CARE_PROVIDER_SITE_OTHER): Payer: Medicare Other | Admitting: Obstetrics & Gynecology

## 2016-12-26 DIAGNOSIS — Z30431 Encounter for routine checking of intrauterine contraceptive device: Secondary | ICD-10-CM

## 2016-12-26 DIAGNOSIS — Z30432 Encounter for removal of intrauterine contraceptive device: Secondary | ICD-10-CM | POA: Diagnosis not present

## 2016-12-26 NOTE — Progress Notes (Signed)
GYNECOLOGY  VISIT   HPI: 46 y.o. G71P0000 Divorced Caucasian female here for IUD removal.  Reports she received letter from medicare regarding denial for lack of coverage for IUD removal and replacement so has decided to proceed with removal.  I have not received this letter to date.  Pt appreciate of effort to try and get IUD coverage.  GYNECOLOGIC HISTORY: No LMP recorded. Patient is not currently having periods (Reason: IUD). Contraception: abstinence  Menopausal hormone therapy: none  Patient Active Problem List   Diagnosis Date Noted  . Anxiety 10/03/2016  . Benzodiazepine dependence ([place]) 10/03/2016  . Severe episode of recurrent major depressive disorder, without psychotic features ([place]) 06/06/2016  . 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 Abnormal Pap smear of cervix 2003   had cryo  . Brain tumor ([place])    brain meningioma(3), 1 malignant, radiation, pt had radiation again 2015  . Chest pain   . Endometriosis 1/12   per dr Ubaldo Glassing  . Hearing loss    left  . Light headed   . OCD (obsessive compulsive disorder)   . RA (rheumatoid arthritis) ([place])   . STD (sexually transmitted disease)    HSV2    Past Surgical History:  Procedure Laterality Date  . CRYOTHERAPY     for abnormal pap smear 2003  . INTRAUTERINE DEVICE (IUD) INSERTION     mirena inserted 2/13 due for removal 2/18    MEDS:  Reviewed in EPIC and UTD  ALLERGIES: Other and Oxycodone  Family History  Problem Relation Age of Onset  . Migraines Father   . Hypertension Father   . Hypertension Mother   . Cancer Maternal Grandfather     SH:  Divorced, non smoker  Review of Systems  All other systems reviewed and are negative.   PHYSICAL EXAMINATION:    BP 110/80 (BP Location: Right Arm, Patient Position: Sitting, Cuff Size: Normal)   Pulse 76   Resp 14   Ht 5' 5.5" (1.664 m)   Wt 165 lb  (74.8 kg)   BMI 27.04 kg/m     General appearance: alert, cooperative and appears stated age  Pelvic: External genitalia:  no lesions              Urethra:  normal appearing urethra with no masses, tenderness or lesions              Bartholins and Skenes: normal                 Vagina: normal appearing vagina with normal color and discharge, no lesions              Cervix: no lesions and IUD string noted              Bimanual Exam:  Uterus:  normal size, contour, position, consistency, mobility, non-tender              Adnexa: no mass, fullness, tenderness   Procedure:  Cervix cleansed with Betadine x 3.  IUD string noted and grasped with ringed forceps.  IUD removed with one pull.  CMA, Lowell Bouton, and I visualized IUD before discarding.  Pt tolerated procedure well.  Minimal spotting noted.  Speculum removed.   Chaperone was present for exam.  Assessment: Desired Mirena IUD removal  Plan: Pt considering new IUD placement at [facility] for cost considerations.

## 2016-12-29 ENCOUNTER — Encounter: Payer: Self-pay | Admitting: Obstetrics & Gynecology

## 2017-03-13 DIAGNOSIS — F458 Other somatoform disorders: Secondary | ICD-10-CM | POA: Insufficient documentation

## 2017-03-13 DIAGNOSIS — G40919 Epilepsy, unspecified, intractable, without status epilepticus: Secondary | ICD-10-CM | POA: Insufficient documentation

## 2017-05-04 ENCOUNTER — Other Ambulatory Visit: Payer: Self-pay | Admitting: Certified Nurse Midwife

## 2017-05-04 DIAGNOSIS — Z1231 Encounter for screening mammogram for malignant neoplasm of breast: Secondary | ICD-10-CM

## 2017-05-23 ENCOUNTER — Ambulatory Visit
Admission: RE | Admit: 2017-05-23 | Discharge: 2017-05-23 | Disposition: A | Discharge status: Home / Self Care | Payer: Medicare Other | Source: Ambulatory Visit | Attending: Certified Nurse Midwife | Admitting: Certified Nurse Midwife

## 2017-05-23 DIAGNOSIS — Z1231 Encounter for screening mammogram for malignant neoplasm of breast: Secondary | ICD-10-CM

## 2017-10-13 ENCOUNTER — Ambulatory Visit: Payer: Medicare Other | Admitting: Certified Nurse Midwife

## 2017-11-21 ENCOUNTER — Encounter: Payer: Self-pay | Admitting: Certified Nurse Midwife

## 2017-11-21 ENCOUNTER — Other Ambulatory Visit: Payer: Self-pay

## 2017-11-21 ENCOUNTER — Other Ambulatory Visit (HOSPITAL_COMMUNITY)
Admission: RE | Admit: 2017-11-21 | Discharge: 2017-11-21 | Disposition: A | Discharge status: Home / Self Care | Payer: Medicare Other | Source: Ambulatory Visit | Attending: Certified Nurse Midwife | Admitting: Certified Nurse Midwife

## 2017-11-21 ENCOUNTER — Ambulatory Visit: Payer: Medicare Other | Admitting: Certified Nurse Midwife

## 2017-11-21 VITALS — BP 120/80 | HR 60 | Resp 14 | Ht 65.5 in | Wt 160.4 lb

## 2017-11-21 DIAGNOSIS — Z124 Encounter for screening for malignant neoplasm of cervix: Secondary | ICD-10-CM

## 2017-11-21 DIAGNOSIS — Z01411 Encounter for gynecological examination (general) (routine) with abnormal findings: Secondary | ICD-10-CM | POA: Diagnosis not present

## 2017-11-21 DIAGNOSIS — Z01419 Encounter for gynecological examination (general) (routine) without abnormal findings: Secondary | ICD-10-CM

## 2017-11-21 DIAGNOSIS — Z30431 Encounter for routine checking of intrauterine contraceptive device: Secondary | ICD-10-CM

## 2017-11-21 DIAGNOSIS — Z1211 Encounter for screening for malignant neoplasm of colon: Secondary | ICD-10-CM | POA: Diagnosis not present

## 2017-11-21 NOTE — Patient Instructions (Signed)

## 2017-11-21 NOTE — Progress Notes (Signed)
47 y.o. G0P0000 Divorced  Caucasian Fe here for annual exam. Recent radiation and chemotherapy for brain tumor on brain stem. Had family with her for support, which was very helpful. Hopeful with this treatment. Anxiety has increased due not having a job at this time. Works with [employer] and looking for other options.. IUD working well, periods none, minimal cramping. 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 Sees Dr. Lavone Neri for aex and labs. Feels depressed with life changes and still taking depression medications with PCP management. No other health issues today.  No LMP recorded. (Menstrual status: IUD).          Sexually active: Yes.    The current method of family planning is IUD.    Exercising: Yes.    strength training, running, cycling Smoker:  no  Health Maintenance: Pap:  10-09-15 neg History of Abnormal Pap: yes MMG:  05-23-17 category c density birads 1:neg Self Breast exams: yes Colonoscopy: IFOB dispensed today BMD:   none TDaP:  2014 Shingles: no Pneumonia: no Hep C and HIV: no Labs: PCP   reports that she has never smoked. She has never used smokeless tobacco. She reports that she drinks about 8.4 - 9.6 oz of alcohol per week. She reports that she does not use drugs.  Past Medical History:  Diagnosis Date  . Abnormal Pap smear of cervix 2003   had cryo  . Brain tumor ([place])    brain meningioma(3), 1 malignant, radiation, pt had radiation again 2015  . Chest pain   . Endometriosis 1/12   per dr Ubaldo Glassing  . Hearing loss    left  . Light headed   . OCD (obsessive compulsive disorder)   . RA (rheumatoid arthritis) ([place])   . STD (sexually transmitted disease)    HSV2    Past Surgical History:  Procedure Laterality Date  . CRYOTHERAPY     for abnormal pap smear 2003  . INTRAUTERINE DEVICE (IUD) INSERTION     mirena inserted 2/13 due for removal 2/18    Current Outpatient Medications  Medication Sig Dispense Refill  . acetaminophen (TYLENOL) 500 MG tablet Take  500 mg by mouth every 6 (six) hours as needed.      Marland Kitchen acyclovir (ZOVIRAX) 400 MG tablet TAKE 1 TABLET BY MOUTH TWICE A DAY 60 tablet 12  . amitriptyline (ELAVIL) 25 MG tablet Take 1 tablet by mouth at bedtime.    . diazepam (VALIUM) 2 MG tablet Take 2 mg by mouth daily.   2  . Docusate Sodium (COLACE PO) Take by mouth.    . gabapentin (NEURONTIN) 300 MG capsule Take 300 mg by mouth 3 (three) times daily.    . hydrochlorothiazide (,MICROZIDE/HYDRODIURIL,) 12.5 MG capsule Take 12.5 mg by mouth daily.      Marland Kitchen HYDROmorphone (DILAUDID) 4 MG tablet   0  . hydrOXYzine (ATARAX/VISTARIL) 10 MG tablet daily.    Marland Kitchen ibuprofen (ADVIL,MOTRIN) 200 MG tablet Take 200 mg by mouth every 6 (six) hours as needed.      Marland Kitchen levonorgestrel (MIRENA, 52 MG,) 20 MCG/24HR IUD by Intrauterine route.    . Melatonin 10 MG TABS Take by mouth.    . Multiple Vitamins-Minerals (CENTRUM) tablet Take 1 tablet by mouth daily.      . naloxone (NARCAN) nasal spray 4 mg/0.1 mL NAR REP ALN    . ondansetron (ZOFRAN) 8 MG tablet Take by mouth.    . sertraline (ZOLOFT) 100 MG tablet TK 2 TS PO QD  0  . propranolol (INDERAL) 20 MG tablet Take 1 tablet by mouth daily.     No current facility-administered medications for this visit.     Family History  Problem Relation Age of Onset  . Migraines Father   . Hypertension Father   . Hypertension Mother   . Cancer Maternal Grandfather     ROS:  Pertinent items are noted in HPI.  Otherwise, a comprehensive ROS was negative.  Exam:   BP 120/80 (BP Location: Right Arm, Patient Position: Sitting, Cuff Size: Normal)   Pulse 60   Resp 14   Ht 5' 5.5" (1.664 m)   Wt 160 lb 6.4 oz (72.8 kg)   BMI 26.29 kg/m  Height: 5' 5.5" (166.4 cm) Ht Readings from Last 3 Encounters:  11/21/17 5' 5.5" (1.664 m)  12/26/16 5' 5.5" (1.664 m)  11/08/16 5' 5.5" (1.664 m)    General appearance: alert, cooperative and appears stated age Head: Normocephalic, without obvious abnormality, atraumatic Neck:  no adenopathy, supple, symmetrical, trachea midline and thyroid normal to inspection and palpation Lungs: clear to auscultation bilaterally Breasts: normal appearance, no masses or tenderness, No nipple retraction or dimpling, No nipple discharge or bleeding, No axillary or supraclavicular adenopathy Heart: regular rate and rhythm Abdomen: soft, non-tender; no masses,  no organomegaly Extremities: extremities normal, atraumatic, no cyanosis or edema Skin: Skin color, texture, turgor normal. No rashes or lesions Lymph nodes: Cervical, supraclavicular, and axillary nodes normal. No abnormal inguinal nodes palpated Neurologic: Grossly normal   Pelvic: External genitalia:  no lesions              Urethra:  normal appearing urethra with no masses, tenderness or lesions              Bartholin's and Skene's: normal                 Vagina: normal appearing vagina with normal color and discharge, no lesions              Cervix: no cervical motion tenderness, no lesions and normal appearance, IUD string noted in cervical canal               Pap taken: Yes.   Bimanual Exam:  Uterus:  normal size, contour, position, consistency, mobility, non-tender and anteverted              Adnexa: normal adnexa and no mass, fullness, tenderness               Rectovaginal: Confirms               Anus:  normal sphincter tone, no lesions  Chaperone present: yes  A:  Well Woman with normal exam  Contraception Mirena IUD inserted 6/18 due for removal 2023  Atypical intracranial meninigoma recent radiation and chemotherapy with follow [facility] at [facility].  Support system good with family  Depression with medication management with PCP  P:   Reviewed health and wellness pertinent to exam  Warning signs with IUD reviewed and need to advise if occur  Continue follow up as indicated with MD  Continue to gain support from family  Continue follow up with PCP as indicated  IFOB dispensed, declines colonoscopy  Pap  smear: yes   counseled on breast self exam, mammography screening, feminine hygiene, adequate intake of calcium and vitamin D, diet and exercise  return annually or prn  An After Visit Summary was printed and given to the patient.

## 2017-11-23 LAB — CYTOLOGY - PAP
Diagnosis: NEGATIVE
HPV: NOT DETECTED

## 2017-12-27 ENCOUNTER — Encounter: Payer: Self-pay | Admitting: Certified Nurse Midwife

## 2018-01-05 LAB — FECAL OCCULT BLOOD, IMMUNOCHEMICAL: FECAL OCCULT BLD: NEGATIVE

## 2018-04-09 DIAGNOSIS — G9601 Cranial cerebrospinal fluid leak, spontaneous: Secondary | ICD-10-CM | POA: Insufficient documentation

## 2018-04-10 DIAGNOSIS — F32A Depression, unspecified: Secondary | ICD-10-CM | POA: Insufficient documentation

## 2018-04-10 DIAGNOSIS — F329 Major depressive disorder, single episode, unspecified: Secondary | ICD-10-CM | POA: Insufficient documentation

## 2018-05-25 DIAGNOSIS — T8189XA Other complications of procedures, not elsewhere classified, initial encounter: Secondary | ICD-10-CM | POA: Insufficient documentation

## 2018-05-25 DIAGNOSIS — S0100XA Unspecified open wound of scalp, initial encounter: Secondary | ICD-10-CM | POA: Insufficient documentation

## 2018-06-19 ENCOUNTER — Other Ambulatory Visit: Payer: Self-pay | Admitting: Certified Nurse Midwife

## 2018-06-19 DIAGNOSIS — Z1231 Encounter for screening mammogram for malignant neoplasm of breast: Secondary | ICD-10-CM

## 2018-07-31 ENCOUNTER — Ambulatory Visit
Admission: RE | Admit: 2018-07-31 | Discharge: 2018-07-31 | Disposition: A | Discharge status: Home / Self Care | Payer: Medicare Other | Source: Ambulatory Visit | Attending: Certified Nurse Midwife | Admitting: Certified Nurse Midwife

## 2018-07-31 DIAGNOSIS — Z1231 Encounter for screening mammogram for malignant neoplasm of breast: Secondary | ICD-10-CM

## 2018-10-01 ENCOUNTER — Other Ambulatory Visit: Payer: Self-pay

## 2018-11-27 ENCOUNTER — Ambulatory Visit: Payer: Medicare Other | Admitting: Certified Nurse Midwife

## 2018-12-13 ENCOUNTER — Ambulatory Visit (HOSPITAL_COMMUNITY): Payer: Self-pay | Admitting: Psychiatry

## 2019-01-08 ENCOUNTER — Telehealth (HOSPITAL_COMMUNITY): Payer: Self-pay | Admitting: Psychiatry

## 2019-03-04 NOTE — Progress Notes (Signed)
48 y.o. G0P0000 Divorced  Caucasian Fe here for annual exam. Contraception IUD working well, [facility]. Health issues with brain tumor reoccurrence and removal in 9/19, has reoccurred and now doing chemotherapy now. Has not seen PCP, due to seeing oncology every other week.    No LMP recorded. (Menstrual status: IUD).          Sexually active: No.  The current method of family planning is IUD.    Exercising: Yes.    run & weights Smoker:  no  Review of Systems  Constitutional: Negative.   HENT: Negative.   Eyes: Negative.   Respiratory: Negative.   Cardiovascular: Negative.   Gastrointestinal: Negative.   Genitourinary: Negative.   Musculoskeletal: Negative.   Skin: Negative.   Neurological: Negative.   Endo/Heme/Allergies: Negative.   Psychiatric/Behavioral: Negative.     Health Maintenance: Pap:  10-09-15 neg, 11-21-17 neg HPV HR neg History of Abnormal Pap: yes MMG:  07-31-2018 category d density birads 1:neg Self Breast exams: yes Colonoscopy:  none BMD:   none TDaP:  2014 Shingles: no Pneumonia: 2019 Hep C and HIV: not done Labs: no   reports that she has never smoked. She has never used smokeless tobacco. She reports current alcohol use of about 14.0 - 16.0 standard drinks of alcohol per week. She reports that she does not use drugs.  Past Medical History:  Diagnosis Date  . Abnormal Pap smear of cervix 2003   had cryo  . Brain tumor ([place])    brain meningioma(3), 1 malignant, radiation, pt had radiation again 2015  . Chest pain   . Endometriosis 1/12   per dr Ubaldo Glassing  . Hearing loss    left  . Light headed   . OCD (obsessive compulsive disorder)   . RA (rheumatoid arthritis) ([place])   . STD (sexually transmitted disease)    HSV2    Past Surgical History:  Procedure Laterality Date  . CRYOTHERAPY     for abnormal pap smear 2003  . INTRAUTERINE DEVICE (IUD) INSERTION     mirena inserted 2/13 due for removal 2/18    Current  Outpatient Medications  Medication Sig Dispense Refill  . acetaminophen (TYLENOL) 500 MG tablet Take 500 mg by mouth every 6 (six) hours as needed.      Marland Kitchen acyclovir (ZOVIRAX) 400 MG tablet TAKE 1 TABLET BY MOUTH TWICE A DAY 60 tablet 12  . amitriptyline (ELAVIL) 25 MG tablet Take 1 tablet by mouth at bedtime.    . diazepam (VALIUM) 2 MG tablet Take 2 mg by mouth daily.   2  . Docusate Sodium (COLACE PO) Take by mouth.    . gabapentin (NEURONTIN) 300 MG capsule Take 300 mg by mouth 3 (three) times daily.    . hydrochlorothiazide (,MICROZIDE/HYDRODIURIL,) 12.5 MG capsule Take 12.5 mg by mouth daily.      Marland Kitchen HYDROmorphone (DILAUDID) 4 MG tablet   0  . hydrOXYzine (ATARAX/VISTARIL) 10 MG tablet daily.    Marland Kitchen ibuprofen (ADVIL,MOTRIN) 200 MG tablet Take 200 mg by mouth every 6 (six) hours as needed.      Marland Kitchen levonorgestrel (MIRENA, 52 MG,) 20 MCG/24HR IUD by Intrauterine route.    . Melatonin 10 MG TABS Take by mouth.    . Multiple Vitamins-Minerals (CENTRUM) tablet Take 1 tablet by mouth daily.      . naloxone (NARCAN) nasal spray 4 mg/0.1 mL NAR REP ALN    . ondansetron (ZOFRAN) 8 MG tablet Take by mouth.    Marland Kitchen  propranolol (INDERAL) 20 MG tablet Take 1 tablet by mouth daily.    . sertraline (ZOLOFT) 100 MG tablet TK 2 TS PO QD  0   No current facility-administered medications for this visit.     Family History  Problem Relation Age of Onset  . Migraines Father   . Hypertension Father   . Hypertension Mother   . Cancer Maternal Grandfather   . Breast cancer Neg Hx     ROS:  Pertinent items are noted in HPI.  Otherwise, a comprehensive ROS was negative.  Exam:   There were no vitals taken for this visit.   Ht Readings from Last 3 Encounters:  11/21/17 5' 5.5" (1.664 m)  12/26/16 5' 5.5" (1.664 m)  11/08/16 5' 5.5" (1.664 m)    General appearance: alert, cooperative and appears stated age Head: Normocephalic, without obvious abnormality, atraumatic Neck: no adenopathy, supple,  symmetrical, trachea midline and thyroid normal to inspection and palpation Lungs: clear to auscultation bilaterally Breasts: normal appearance, no masses or tenderness, No nipple retraction or dimpling, No nipple discharge or bleeding, No axillary or supraclavicular adenopathy Heart: regular rate and rhythm Abdomen: soft, non-tender; no masses,  no organomegaly Extremities: extremities normal, atraumatic, no cyanosis or edema Skin: Skin color, texture, turgor normal. No rashes or lesions Lymph nodes: Cervical, supraclavicular, and axillary nodes normal. No abnormal inguinal nodes palpated Neurologic: Grossly normal   Pelvic: External genitalia:  no lesions              Urethra:  normal appearing urethra with no masses, tenderness or lesions              Bartholin's and Skene's: normal                 Vagina: normal appearing vagina with normal color and discharge, no lesions              Cervix: no bleeding following Pap, no cervical motion tenderness and no lesions              Pap taken: Yes.   Bimanual Exam:  Uterus:  normal size, contour, position, consistency, mobility, non-tender and anteverted              Adnexa: normal adnexa and no mass, fullness, tenderness               Rectovaginal: Confirms               Anus:  normal sphincter tone, no lesions  Chaperone present: yes  A:  Well Woman with normal exam  Contraception Mirena IUD due for removal 2024  Reoccurrence of brain tumor doing chemotherapy now  All labs with oncology  Colonoscopy due  P:   Reviewed health and wellness pertinent to exam  Warning signs reviewed with use and need to advise if occurs.  Continue follow up as indicated  Declines any screening at this time, will discuss with oncology.  Pap smear: yes   counseled on breast self exam, mammography screening, feminine hygiene, adequate intake of calcium and vitamin D, diet and exercise  return annually or prn  An After Visit Summary was printed and  given to the patient.

## 2019-03-05 ENCOUNTER — Ambulatory Visit (INDEPENDENT_AMBULATORY_CARE_PROVIDER_SITE_OTHER): Payer: Medicare Other | Admitting: Certified Nurse Midwife

## 2019-03-05 ENCOUNTER — Other Ambulatory Visit (HOSPITAL_COMMUNITY)
Admission: RE | Admit: 2019-03-05 | Discharge: 2019-03-05 | Disposition: A | Discharge status: Home / Self Care | Payer: Medicare Other | Source: Ambulatory Visit | Attending: Cardiology | Admitting: Cardiology

## 2019-03-05 ENCOUNTER — Other Ambulatory Visit: Payer: Self-pay

## 2019-03-05 ENCOUNTER — Encounter: Payer: Self-pay | Admitting: Certified Nurse Midwife

## 2019-03-05 VITALS — BP 110/70 | HR 68 | Temp 97.1°F | Resp 16 | Ht 65.25 in | Wt 142.0 lb

## 2019-03-05 DIAGNOSIS — Z124 Encounter for screening for malignant neoplasm of cervix: Secondary | ICD-10-CM | POA: Insufficient documentation

## 2019-03-05 DIAGNOSIS — Z01419 Encounter for gynecological examination (general) (routine) without abnormal findings: Secondary | ICD-10-CM | POA: Diagnosis not present

## 2019-03-05 NOTE — Patient Instructions (Signed)

## 2019-03-07 LAB — CYTOLOGY - PAP: Diagnosis: NEGATIVE

## 2019-08-27 ENCOUNTER — Other Ambulatory Visit: Payer: Self-pay | Admitting: Certified Nurse Midwife

## 2019-08-27 DIAGNOSIS — Z1231 Encounter for screening mammogram for malignant neoplasm of breast: Secondary | ICD-10-CM

## 2019-09-24 ENCOUNTER — Ambulatory Visit
Admission: RE | Admit: 2019-09-24 | Discharge: 2019-09-24 | Disposition: A | Discharge status: Home / Self Care | Payer: Medicare Other | Source: Ambulatory Visit | Attending: Certified Nurse Midwife | Admitting: Certified Nurse Midwife

## 2019-09-24 ENCOUNTER — Other Ambulatory Visit: Payer: Self-pay

## 2019-09-24 DIAGNOSIS — Z1231 Encounter for screening mammogram for malignant neoplasm of breast: Secondary | ICD-10-CM

## 2019-09-25 NOTE — Progress Notes (Signed)
Mammogram reviewed negative  Density is D noted. Will need to discuss if additional imaging is needed yearly at next visit due to density.

## 2019-10-04 ENCOUNTER — Encounter: Payer: Self-pay | Admitting: Certified Nurse Midwife

## 2020-06-29 ENCOUNTER — Ambulatory Visit: Payer: Medicare Other | Admitting: Physical Therapy

## 2020-06-30 ENCOUNTER — Other Ambulatory Visit: Payer: Self-pay

## 2020-06-30 ENCOUNTER — Encounter (HOSPITAL_BASED_OUTPATIENT_CLINIC_OR_DEPARTMENT_OTHER): Payer: Self-pay | Admitting: *Deleted

## 2020-06-30 ENCOUNTER — Encounter (HOSPITAL_COMMUNITY): Payer: Self-pay | Admitting: Emergency Medicine

## 2020-06-30 ENCOUNTER — Emergency Department (HOSPITAL_BASED_OUTPATIENT_CLINIC_OR_DEPARTMENT_OTHER): Payer: Medicare Other

## 2020-06-30 ENCOUNTER — Emergency Department (HOSPITAL_BASED_OUTPATIENT_CLINIC_OR_DEPARTMENT_OTHER)
Admission: EM | Admit: 2020-06-30 | Discharge: 2020-06-30 | Disposition: A | Discharge status: Home / Self Care | Payer: Medicare Other | Source: Home / Self Care | Attending: Emergency Medicine | Admitting: Emergency Medicine

## 2020-06-30 ENCOUNTER — Emergency Department (HOSPITAL_COMMUNITY)
Admission: EM | Admit: 2020-06-30 | Discharge: 2020-06-30 | Disposition: A | Discharge status: Home / Self Care | Payer: Medicare Other | Attending: Emergency Medicine | Admitting: Emergency Medicine

## 2020-06-30 DIAGNOSIS — Z85841 Personal history of malignant neoplasm of brain: Secondary | ICD-10-CM | POA: Insufficient documentation

## 2020-06-30 DIAGNOSIS — M542 Cervicalgia: Secondary | ICD-10-CM | POA: Diagnosis not present

## 2020-06-30 DIAGNOSIS — R040 Epistaxis: Secondary | ICD-10-CM | POA: Insufficient documentation

## 2020-06-30 DIAGNOSIS — R519 Headache, unspecified: Secondary | ICD-10-CM | POA: Insufficient documentation

## 2020-06-30 NOTE — Discharge Instructions (Signed)
Please return for uncontrolled nosebleed or if you had sudden worsening of your headache or new neurologic symptoms.  Otherwise please follow-up with your neuro oncologist.

## 2020-06-30 NOTE — ED Triage Notes (Signed)
Nose bleed on and off since last night. She was at [facility] but the wait was long so she left without being seen. No bleed on arrival.

## 2020-06-30 NOTE — ED Notes (Addendum)
Pt stated she was not going to wait and get seen. This NT informed pt that we wanted her to stay and get seen by EDP. Pt verbalized understanding, and stated she would return if symptoms worsened.

## 2020-06-30 NOTE — ED Provider Notes (Signed)
[facility] EMERGENCY DEPARTMENT Provider Note   CSN: [id number] Arrival date & time: 06/30/20  1740     History Chief Complaint  Patient presents with  . Epistaxis    Yesenia Mcclure is a 49 y.o. female.  49 yo F with a chief complaints of episodic nosebleeds.  Going on since yesterday.  She had called her oncologist who suggested that she hold pressure for 10 minutes and then repeat that 3 times if it did not resolve things to head to the emergency department.  Patient states they tend to occur when she puts her chin to her chest and holds her head down for some time.  She has been feeling some worsening pressure to her head.  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  Sees pain management for this and gets infusions.  Typically has to lay with her head on the left side.  She has seen ENT in the past for a nasal septal defect.  This was caused by one of her medications to prevent recurrence of her brain tumor.  She had discussed the worsening headaches with her oncologist nursing line who suggested she come to the ED for head imaging.  She denies new numbness or weakness.  Denies difficulty speech or swallowing.  The history is provided by the patient.  Epistaxis Associated symptoms: headaches   Associated symptoms: no congestion, no dizziness and no fever   Illness Severity:  Moderate Onset quality:  Gradual Duration:  2 days Timing:  Constant Progression:  Worsening Chronicity:  New Associated symptoms: headaches   Associated symptoms: no chest pain, no congestion, no fever, no myalgias, no nausea, no rhinorrhea, no shortness of breath, no vomiting and no wheezing        Past Medical History:  Diagnosis Date  . Abnormal Pap smear of cervix 2003   had cryo  . Brain tumor ([place])    brain meningioma(3), 1 malignant, radiation, pt had radiation again 2015  . Chest pain   . Endometriosis 1/12   per dr Ubaldo Glassing  . Hearing loss    left  . Light headed   . OCD  (obsessive compulsive disorder)   . RA (rheumatoid arthritis) ([place])   . STD (sexually transmitted disease)    HSV2    Patient Active Problem List   Diagnosis Date Noted  . Open wound of scalp 05/25/2018  . Surgical wound, non healing 05/25/2018  . Depression 04/10/2018  . CSF (cerebrospinal rhinorrhea) 04/09/2018  . 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 Anxiety 10/03/2016  . Benzodiazepine dependence ([place]) 10/03/2016  . GAD (generalized anxiety disorder) 10/03/2016  . 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 BRAIN TUMOR EXCISION  03/2019  . CRYOTHERAPY     for abnormal pap smear 2003  . INTRAUTERINE DEVICE (IUD) INSERTION     mirena inserted 2/13 due for removal 2/18     OB History    Gravida  0   Para  0   Term  0   Preterm  0   AB  0   Living  0     SAB  0   IAB  0   Ectopic  0   Multiple  0   Live Births  Family History  Problem Relation Age of Onset  . Migraines Father   . Hypertension Father   . Hypertension Mother   . Cancer Maternal Grandfather     Social History   Tobacco Use  . Smoking status: Never Smoker  . Smokeless tobacco: Never Used  Substance Use Topics  . Alcohol use: Yes    Alcohol/week: 7.0 standard drinks    Types: 7 Standard drinks or equivalent per week  . Drug use: No    Home Medications Prior to Admission medications   Medication Sig Start Date End Date Taking? Authorizing Provider  acetaminophen (TYLENOL) 500 MG tablet Take 500 mg by mouth every 6 (six) hours as needed.      [provider]  acyclovir (ZOVIRAX) 400 MG tablet TAKE 1 TABLET BY MOUTH TWICE A DAY 08/29/14    Regina Eck, CNM  amitriptyline (ELAVIL) 10 MG tablet TK 1 T PO QHS 01/15/19   [provider]  buPROPion (WELLBUTRIN XL) 150 MG 24 hr tablet TAKE 1 TABLET(150 MG) BY MOUTH EVERY MORNING 07/17/18   [provider]  Docusate Sodium (COLACE PO) Take by mouth.    [provider]  gabapentin (NEURONTIN) 300 MG capsule Take 300 mg by mouth 3 (three) times daily.    [provider]  hydrochlorothiazide (,MICROZIDE/HYDRODIURIL,) 12.5 MG capsule Take 12.5 mg by mouth daily.      [provider]  HYDROmorphone (DILAUDID) 4 MG tablet  11/20/17   [provider]  hydrOXYzine (ATARAX/VISTARIL) 25 MG tablet TAKE 1/2 TO 2 TABLETS EVERY 8 HOURS AS NEEDED FOR ANXIETY 01/10/19   [provider]  ibuprofen (ADVIL,MOTRIN) 200 MG tablet Take 200 mg by mouth every 6 (six) hours as needed.      [provider]  levonorgestrel (MIRENA, 52 MG,) 20 MCG/24HR IUD by Intrauterine route.    [provider]  Melatonin 10 MG TABS Take by mouth.    [provider]  Multiple Vitamins-Minerals (CENTRUM) tablet Take 1 tablet by mouth daily.      [provider]  naloxone [facility]) nasal spray 4 mg/0.1 mL NAR REP ALN 08/27/17   [provider]  ondansetron (ZOFRAN) 8 MG tablet Take by mouth. 06/21/17   [provider]  sertraline (ZOLOFT) 100 MG tablet TK 2 TS PO QD 07/29/15   [provider]  UNABLE TO FIND viviscal    [provider]    Allergies    Morphine, Other, and Oxycodone  Review of Systems   Review of Systems  Constitutional: Negative for chills and fever.  HENT: Positive for nosebleeds. Negative for congestion and rhinorrhea.   Eyes: Negative for redness and visual disturbance.  Respiratory: Negative for shortness of breath and wheezing.   Cardiovascular: Negative for chest pain and palpitations.  Gastrointestinal: Negative for nausea and vomiting.  Genitourinary: Negative for  dysuria and urgency.  Musculoskeletal: Negative for arthralgias and myalgias.  Skin: Negative for pallor and wound.  Neurological: Positive for headaches. Negative for dizziness.    Physical Exam Updated Vital Signs BP (!) 145/102   Pulse 77   Temp 98 F (36.7 C) (Oral)   Resp 14   Ht 5\' 5"  (1.651 m)   Wt 69.4 kg   SpO2 100%   BMI 25.46 kg/m   Physical Exam Vitals and nursing note reviewed.  Constitutional:      General: She is not in acute distress.    Appearance: She is well-developed and well-nourished. She is not  diaphoretic.  HENT:     Head: Normocephalic and atraumatic.     Nose:     Comments: No signs of bleeding.  Large nasal septal defect over the area I would expect Kiesselbach's plexus to be. Eyes:     Extraocular Movements: EOM normal.     Pupils: Pupils are equal, round, and reactive to light.  Cardiovascular:     Rate and Rhythm: Normal rate and regular rhythm.     Heart sounds: No murmur heard. No friction rub. No gallop.   Pulmonary:     Effort: Pulmonary effort is normal.     Breath sounds: No wheezing or rales.  Abdominal:     General: There is no distension.     Palpations: Abdomen is soft.     Tenderness: There is no abdominal tenderness.  Musculoskeletal:        General: No tenderness or edema.     Cervical back: Normal range of motion and neck supple.  Skin:    General: Skin is warm and dry.  Neurological:     Mental Status: She is alert and oriented to person, place, and time.  Psychiatric:        Mood and Affect: Mood and affect normal.        Behavior: Behavior normal.     ED Results / Procedures / Treatments   Labs (all labs ordered are listed, but only abnormal results are displayed) Labs Reviewed - No data to display  EKG None  Radiology No results found.  Procedures Procedures (including critical care time)  Medications Ordered in ED Medications - No data to display  ED Course  I have reviewed the triage vital signs  and the nursing notes.  Pertinent labs & imaging results that were available during my care of the patient were reviewed by me and considered in my medical decision making (see chart for details).    MDM Rules/Calculators/A&P                          49 yo F with a chief complaints of worsening headaches and nosebleeds.  Patient's nose is not bleeding on arrival here.  No obvious source of bleeding.  She does have a very large nasal septal defect that I suspect could have eroded into a vessel although this not actively bleeding and I feel that cautery for that area would unfortunately worsen her defect will hold off on any treatment.  The patient has been complaining of worsening headaches and was requesting head imaging.  I discussed with her that we do not have MRI available at the stand-alone facility.  I did offer to obtain a noncontrasted head CT.  After thinking this over for some time the patient is going to decline.  She is supposed to follow-up with her neuro oncologist tomorrow and suspects that they should be able to obtain imaging for her if needed.  I did offer her to have her sent over to [facility] for MRI but she had already been there earlier this evening and felt like the wait was likely too long.  I did encourage her to return at any time she was to be reevaluated or if her headache suddenly worsened or if she had other strokelike symptoms or if she had a recurrent nosebleed that did not resolve.  9:25 PM:  I have discussed the diagnosis/risks/treatment options with the patient and caregiver and believe the pt to be eligible for  discharge home to follow-up with [facility]. We also discussed returning to the ED immediately if new or worsening sx occur. We discussed the sx which are most concerning (e.g., sudden worsening pain, fever, inability to tolerate by mouth) that necessitate immediate return. Medications administered to the patient during their visit and any new prescriptions provided  to the patient are listed below.  Medications given during this visit Medications - No data to display   The patient appears reasonably screen and/or stabilized for discharge and I doubt any other medical condition or other [facility] requiring further screening, evaluation, or treatment in the ED at this time prior to discharge.   Final Clinical Impression(s) / ED Diagnoses Final diagnoses:  Epistaxis  Headache in front of head    Rx / DC Orders ED Discharge Orders    None       Deno Etienne, DO 06/30/20 2125

## 2020-06-30 NOTE — ED Triage Notes (Signed)
Pt to ED with c/o left sided nosebleed.  Pt st's she is currently being treated at [facility] for a tumor on her brain steam.  Pt st's today she started to have bleeding from left nostril.  Also c/o pain in back of neck

## 2020-08-31 ENCOUNTER — Other Ambulatory Visit: Payer: Self-pay | Admitting: Certified Nurse Midwife

## 2020-08-31 DIAGNOSIS — Z1231 Encounter for screening mammogram for malignant neoplasm of breast: Secondary | ICD-10-CM

## 2020-10-21 ENCOUNTER — Ambulatory Visit
Admission: RE | Admit: 2020-10-21 | Discharge: 2020-10-21 | Disposition: A | Discharge status: Home / Self Care | Payer: Medicare Other | Source: Ambulatory Visit | Attending: Cardiology | Admitting: Cardiology

## 2020-10-21 ENCOUNTER — Other Ambulatory Visit: Payer: Self-pay

## 2020-10-21 ENCOUNTER — Inpatient Hospital Stay: Admission: RE | Admit: 2020-10-21 | Payer: Medicare Other | Source: Ambulatory Visit

## 2020-10-21 DIAGNOSIS — Z1231 Encounter for screening mammogram for malignant neoplasm of breast: Secondary | ICD-10-CM

## 2021-03-07 IMAGING — MG DIGITAL SCREENING BILAT W/ TOMO W/ CAD
9 series · 9 of 25 positions shown · non-contrast
Comparison: Previous exam(s).

CLINICAL DATA: Screening.

EXAM:
DIGITAL SCREENING BILATERAL MAMMOGRAM WITH TOMO AND CAD

[R MLO]
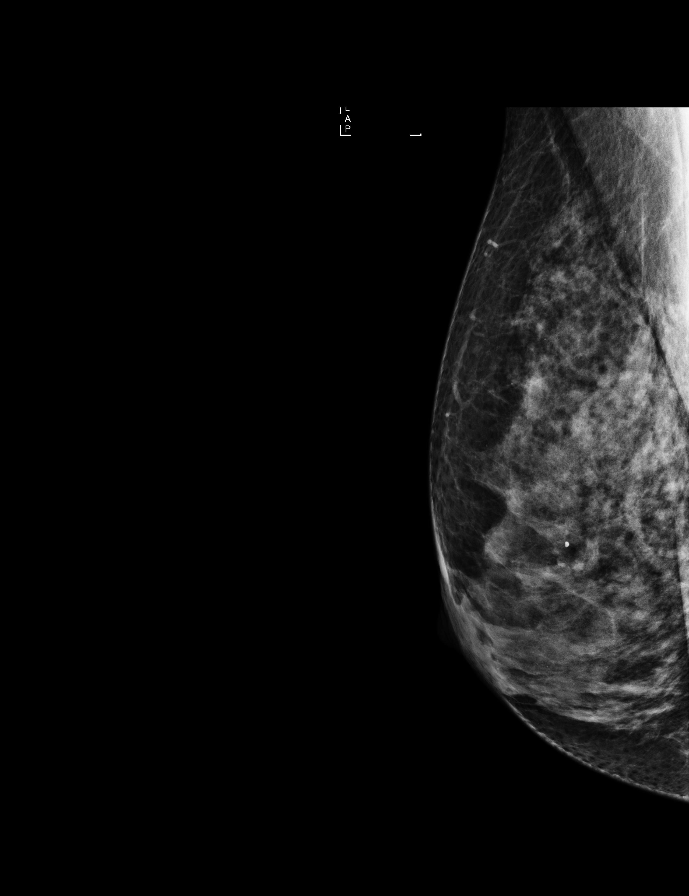

[L MLO synth-2D]
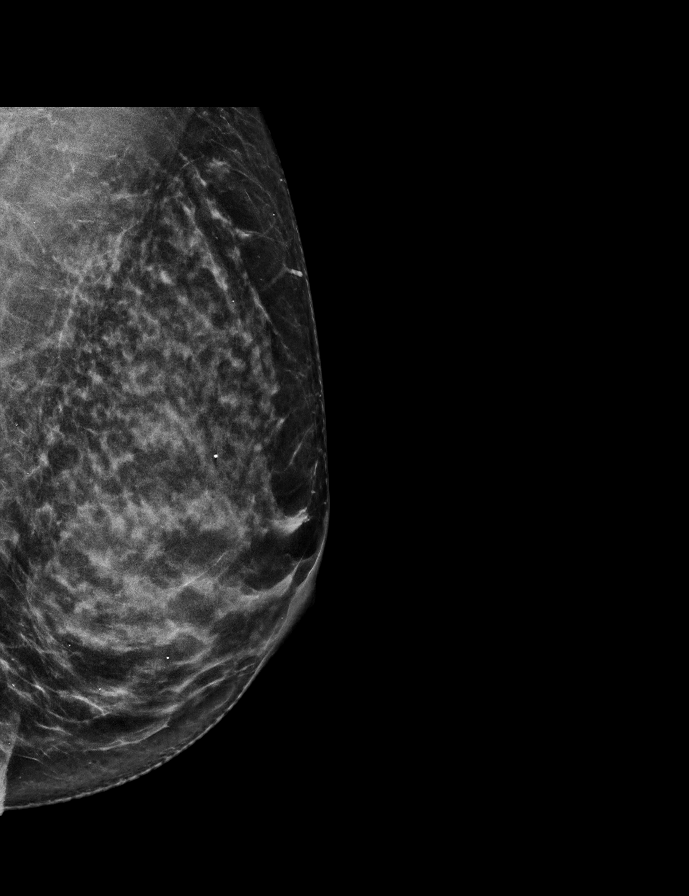

[L CC synth-2D]
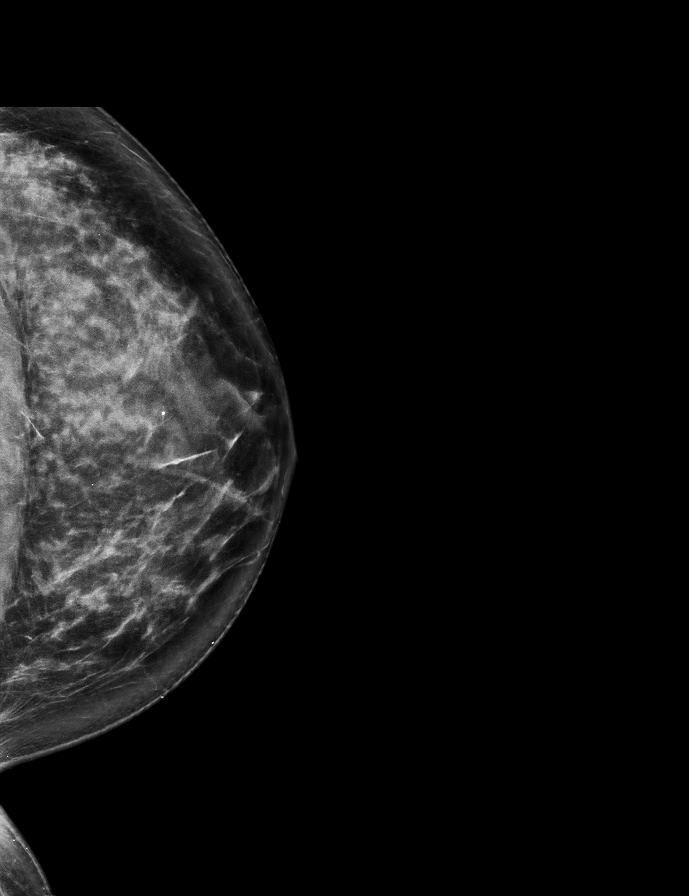

[R MLO synth-2D]
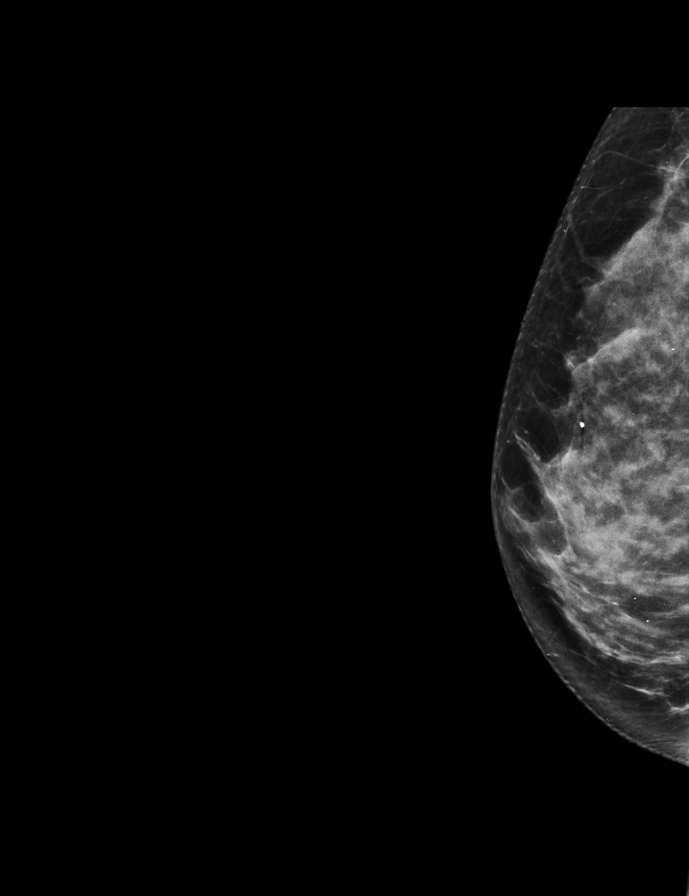

[R CC synth-2D]
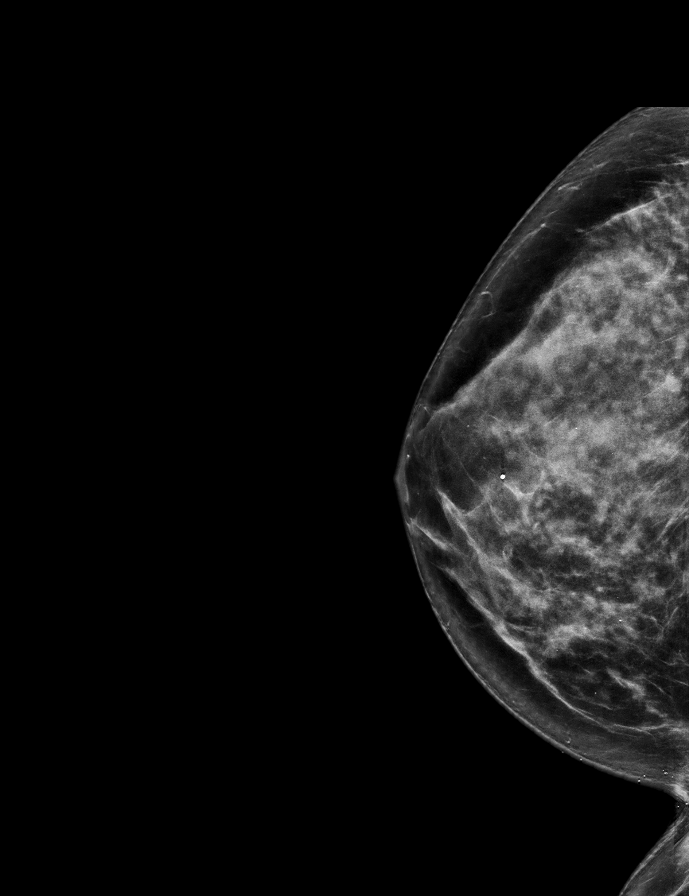

[L MLO tomo · tomo slice 32/63.0]
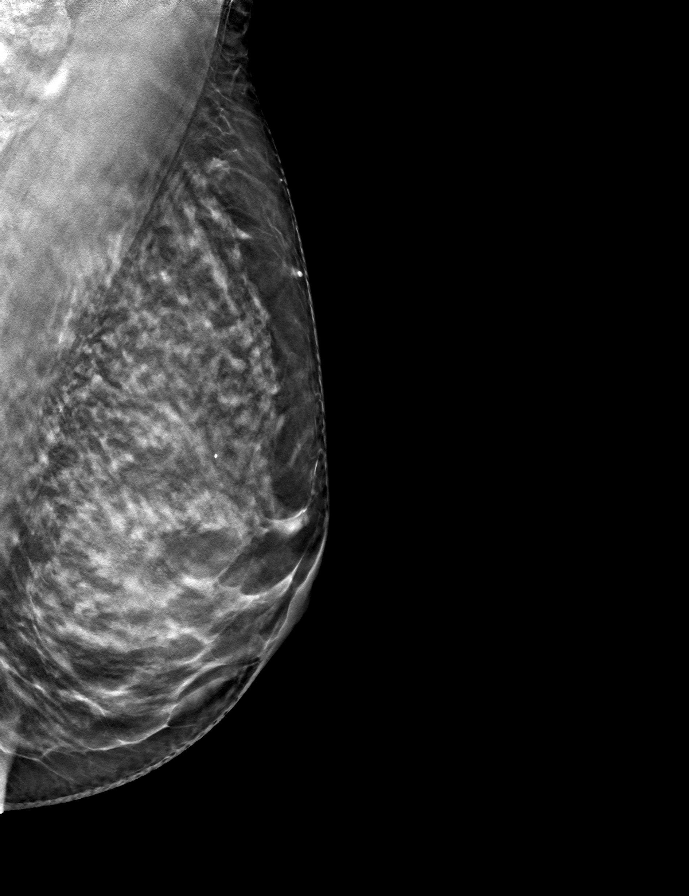

[L CC tomo · tomo slice 37/73.0]
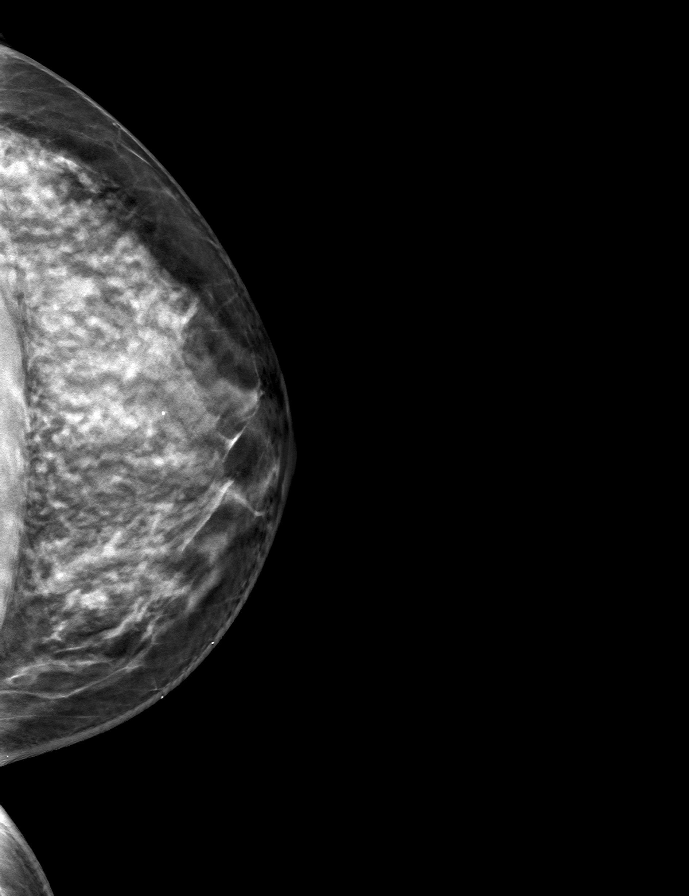

[R CC tomo · tomo slice 35/70.0]
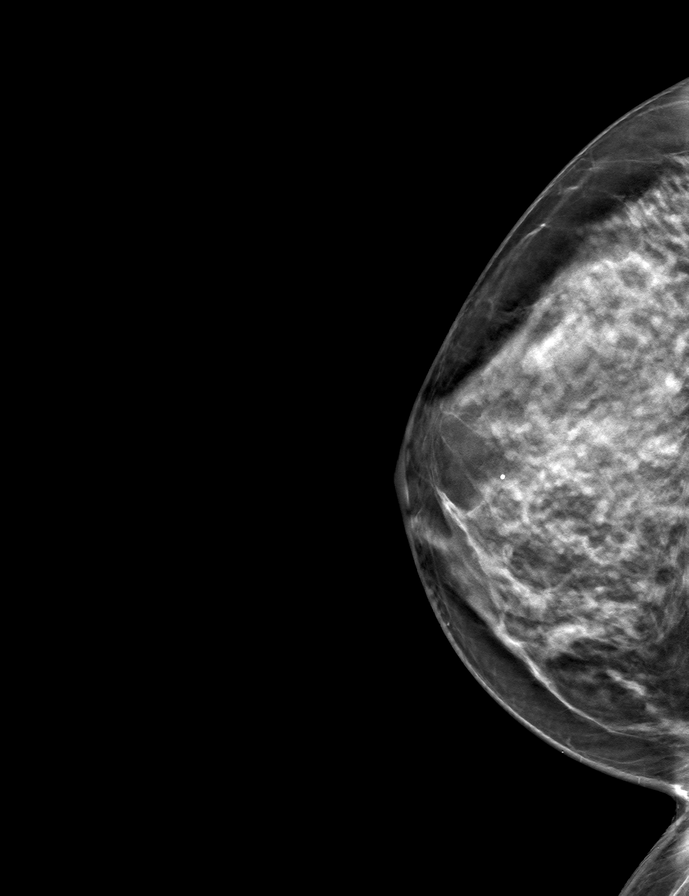

[R MLO tomo · tomo slice 31/61.0]
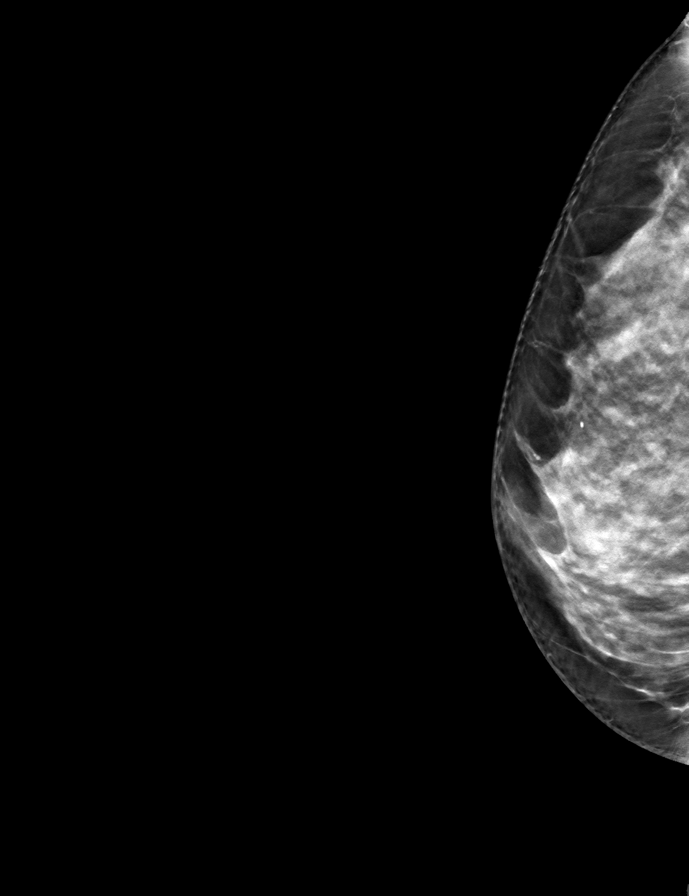

[9 of 25 positions shown; findings below may reference images not displayed]

ACR Breast Density Category d: The breast tissue is extremely dense,
which lowers the sensitivity of mammography
FINDINGS: There are no findings suspicious for malignancy. Images were
processed with CAD.
IMPRESSION: No mammographic evidence of malignancy. A result letter of this
screening mammogram will be mailed directly to the patient.

RECOMMENDATION:
Screening mammogram in one year. (Code:WO-[id number])

BI-RADS CATEGORY  1: Negative.

## 2021-12-16 DEATH — deceased
